# Patient Record
Sex: Female | Born: 1960 | Race: White | Hispanic: No | Marital: Married | Smoking: Former smoker
Health system: Southern US, Community
[De-identification: ages and names within clinical notes are randomized; demographics above are authoritative.]

## PROBLEM LIST (undated history)

## (undated) DIAGNOSIS — T7840XA Allergy, unspecified, initial encounter: Secondary | ICD-10-CM

## (undated) DIAGNOSIS — F329 Major depressive disorder, single episode, unspecified: Secondary | ICD-10-CM

## (undated) DIAGNOSIS — F419 Anxiety disorder, unspecified: Secondary | ICD-10-CM

## (undated) DIAGNOSIS — F32A Depression, unspecified: Secondary | ICD-10-CM

## (undated) DIAGNOSIS — D649 Anemia, unspecified: Secondary | ICD-10-CM

## (undated) DIAGNOSIS — E785 Hyperlipidemia, unspecified: Secondary | ICD-10-CM

## (undated) DIAGNOSIS — G4730 Sleep apnea, unspecified: Secondary | ICD-10-CM

## (undated) HISTORY — DX: Allergy, unspecified, initial encounter: T78.40XA

## (undated) HISTORY — DX: Hyperlipidemia, unspecified: E78.5

## (undated) HISTORY — DX: Major depressive disorder, single episode, unspecified: F32.9

## (undated) HISTORY — DX: Anxiety disorder, unspecified: F41.9

## (undated) HISTORY — DX: Anemia, unspecified: D64.9

## (undated) HISTORY — DX: Sleep apnea, unspecified: G47.30

## (undated) HISTORY — PX: UPPER GI ENDOSCOPY: SHX6162

## (undated) HISTORY — PX: COLONOSCOPY: SHX174

## (undated) HISTORY — PX: TONSILLECTOMY: SUR1361

## (undated) HISTORY — DX: Depression, unspecified: F32.A

---

## 2001-11-22 ENCOUNTER — Ambulatory Visit (HOSPITAL_COMMUNITY): Admission: RE | Admit: 2001-11-22 | Discharge: 2001-11-22 | Payer: Self-pay | Admitting: Gastroenterology

## 2018-02-22 ENCOUNTER — Encounter: Payer: Self-pay | Admitting: Gastroenterology

## 2018-03-02 ENCOUNTER — Encounter: Payer: Self-pay | Admitting: Gastroenterology

## 2018-03-21 ENCOUNTER — Other Ambulatory Visit: Payer: Self-pay

## 2018-03-21 ENCOUNTER — Ambulatory Visit (AMBULATORY_SURGERY_CENTER): Payer: Self-pay

## 2018-03-21 VITALS — Ht 65.0 in | Wt 230.8 lb

## 2018-03-21 DIAGNOSIS — Z1211 Encounter for screening for malignant neoplasm of colon: Secondary | ICD-10-CM

## 2018-03-21 NOTE — Progress Notes (Signed)
No egg or soy allergy known to patient   pt verbalize having surgery 4-16 having her tonsils removed. Verbalize the day after, her husband could not wake her up. She was admitted to [facility] with elevated liver enzyme.  No diet pills per patient No home 02 use per patient  No blood thinners per patient  Pt denies issues with constipation  No A fib or A flutter  EMMI video sent to pt's e mail , pt declined.

## 2018-03-31 NOTE — Progress Notes (Signed)
Osvaldo Angst, CRNA reviewed the patient's hx- ok for [place]

## 2018-04-04 ENCOUNTER — Other Ambulatory Visit: Payer: Self-pay

## 2018-04-04 ENCOUNTER — Encounter: Payer: Self-pay | Admitting: Gastroenterology

## 2018-04-04 ENCOUNTER — Ambulatory Visit (AMBULATORY_SURGERY_CENTER): Admitting: Gastroenterology

## 2018-04-04 VITALS — BP 105/53 | HR 74 | Temp 97.5°F | Resp 17 | Ht 65.0 in | Wt 230.0 lb

## 2018-04-04 DIAGNOSIS — D124 Benign neoplasm of descending colon: Secondary | ICD-10-CM | POA: Diagnosis not present

## 2018-04-04 DIAGNOSIS — Z1211 Encounter for screening for malignant neoplasm of colon: Secondary | ICD-10-CM | POA: Diagnosis present

## 2018-04-04 DIAGNOSIS — K635 Polyp of colon: Secondary | ICD-10-CM | POA: Diagnosis not present

## 2018-04-04 DIAGNOSIS — D127 Benign neoplasm of rectosigmoid junction: Secondary | ICD-10-CM

## 2018-04-04 MED ORDER — SODIUM CHLORIDE 0.9 % IV SOLN: 500.0000 mL | Freq: Once | INTRAVENOUS | Status: DC

## 2018-04-04 NOTE — Patient Instructions (Signed)
YOU HAD AN ENDOSCOPIC PROCEDURE TODAY AT [facility]:   Refer to the procedure report that was given to you for any specific questions about what was found during the examination.  If the procedure report does not answer your questions, please call your gastroenterologist to clarify.  If you requested that your care partner not be given the details of your procedure findings, then the procedure report has been included in a sealed envelope for you to review at your convenience later.  YOU SHOULD EXPECT: Some feelings of bloating in the abdomen. Passage of more gas than usual.  Walking can help get rid of the air that was put into your GI tract during the procedure and reduce the bloating. If you had a lower endoscopy (such as a colonoscopy or flexible sigmoidoscopy) you may notice spotting of blood in your stool or on the toilet paper. If you underwent a bowel prep for your procedure, you may not have a normal bowel movement for a few days.  Please Note:  You might notice some irritation and congestion in your nose or some drainage.  This is from the oxygen used during your procedure.  There is no need for concern and it should clear up in a day or so.  SYMPTOMS TO REPORT IMMEDIATELY:   Following lower endoscopy (colonoscopy or flexible sigmoidoscopy):  Excessive amounts of blood in the stool  Significant tenderness or worsening of abdominal pains  Swelling of the abdomen that is new, acute  Fever of 100F or higher  For urgent or emergent issues, a gastroenterologist can be reached at any hour by calling [phone].   DIET:  We do recommend a small meal at first, but then you may proceed to your regular diet. Please follow a High Fiber Diet (see handout given to you by your recovery nurse.). Drink plenty of fluids but you should avoid alcoholic beverages for 24 hours.  MEDICATIONS: Continue present medications.  Please see handouts given to you by your recovery  nurse.  Follow up with Dr. Lyndel Safe as needed. Use Preparation H one twice daily after each bowel movement for 7-10 days as needed. If this is not effective, Dr. Lyndel Safe will recommend Anusol suppositories. If you are still having problems in the future, Dr. Lyndel Safe may recommend a surgical consult for hemorrhoidectomy as a last resort.  ACTIVITY:  You should plan to take it easy for the rest of today and you should NOT DRIVE or use heavy machinery until tomorrow (because of the sedation medicines used during the test).    FOLLOW UP: Our staff will call the number listed on your records the next business day following your procedure to check on you and address any questions or concerns that you may have regarding the information given to you following your procedure. If we do not reach you, we will leave a message.  However, if you are feeling well and you are not experiencing any problems, there is no need to return our call.  We will assume that you have returned to your regular daily activities without incident.  If any biopsies were taken you will be contacted by phone or by letter within the next 1-3 weeks.  Please call us at [phone] if you have not heard about the biopsies in 3 weeks.   Thank you for allowing [place] to provide for your healthcare needs today.  SIGNATURES/CONFIDENTIALITY: You and/or your care partner have signed paperwork which will be entered into your electronic  medical record.  These signatures attest to the fact that that the information above on your After Visit Summary has been reviewed and is understood.  Full responsibility of the confidentiality of this discharge information lies with you and/or your care-partner.

## 2018-04-04 NOTE — Progress Notes (Signed)
Called to room to assist during endoscopic procedure.  Patient ID and intended procedure confirmed with present staff. Received instructions for my participation in the procedure from the performing physician.  

## 2018-04-04 NOTE — Op Note (Signed)
[place] Patient Name: Kristy Walker Procedure Date: 04/04/2018 8:29 AM MRN: [id number] Endoscopist: Jackquline Denmark MD, MD Age: 57 Referring MD:  Date of Birth: Dec 20, 1960 Gender: Female Account #: [id number] Procedure:                Colonoscopy Indications:              Screening for colorectal malignant neoplasm Medicines:                Monitored Anesthesia Care Procedure:                Pre-Anesthesia Assessment:                           - Prior to the procedure, a History and Physical                            was performed, and patient medications and                            allergies were reviewed. The patient is competent.                            The risks and benefits of the procedure and the                            sedation options and risks were discussed with the                            patient. All questions were answered and informed                            consent was obtained. Patient identification and                            proposed procedure were verified by the physician                            in the procedure room. Mental Status Examination:                            alert and oriented. Prophylactic Antibiotics: The                            patient does not require prophylactic antibiotics.                            Prior Anticoagulants: The patient has taken no                            previous anticoagulant or antiplatelet agents. ASA                            Grade Assessment: II - A patient with mild systemic  disease. After reviewing the risks and benefits,                            the patient was deemed in satisfactory condition to                            undergo the procedure. The anesthesia plan was to                            use monitored anesthesia care (MAC). Immediately                            prior to administration of medications, the patient   was re-assessed for adequacy to receive sedatives.                            The heart rate, respiratory rate, oxygen                            saturations, blood pressure, adequacy of pulmonary                            ventilation, and response to care were monitored                            throughout the procedure. The physical status of                            the patient was re-assessed after the procedure.                           After obtaining informed consent, the colonoscope                            was passed under direct vision. Throughout the                            procedure, the patient's blood pressure, pulse, and                            oxygen saturations were monitored continuously. The                            Colonoscope was introduced through the anus and                            advanced to the the cecum, identified by                            appendiceal orifice and ileocecal valve. The                            colonoscopy was performed without difficulty. The  patient tolerated the procedure well. The quality                            of the bowel preparation was excellent. Scope In: 8:38:35 AM Scope Out: 8:52:15 AM Scope Withdrawal Time: 0 hours 11 minutes 10 seconds  Total Procedure Duration: 0 hours 13 minutes 40 seconds  Findings:                 Small external hemorrhoids were found on perianal                            exam.                           Healed anal fissure was found on perianal exam.                           A 4 mm polyp was found in the descending colon. The                            polyp was sessile. The polyp was removed with a                            cold biopsy forceps. Resection and retrieval were                            complete.                           A 6 mm polyp was found in the recto-sigmoid colon.                            The polyp was sessile. The polyp was removed  with a                            cold snare. Resection and retrieval were complete.                           A few small-mouthed diverticula were found in the                            sigmoid colon.                           Internal hemorrhoids were found during                            retroflexion. The hemorrhoids were Grade II                            (internal hemorrhoids that prolapse but reduce                            spontaneously). Complications:            No immediate complications.  Estimated Blood Loss:     Estimated blood loss was minimal. Impression:               - Colonic polyps status post polypectomy.                           - Mild sigmoid diverticulosis.                           - Internal hemorrhoids and healed posterior anal                            fissure. Recommendation:           - Patient has a contact number available for                            emergencies. The signs and symptoms of potential                            delayed complications were discussed with the                            patient. Return to normal activities tomorrow.                            Written discharge instructions were provided to the                            patient.                           - High fiber diet.                           - Continue present medications.                           - Await pathology results.                           - Repeat colonoscopy date to be determined after                            pending pathology results are reviewed for                            surveillance.                           - Return to GI clinic PRN. if she has any anorectal                            problems, she would use Preparation H one twice a                            day after the bowel movement for 7-10 days and get  in touch with [place]. at that point, we will give her                            Anusol suppositories. If  she still has any                            anorectal problems in the future, I would've low                            threshold in having surgical opinion for possible                            examination under anesthesia followed by                            hemorrhoidectomy. Certainly, that wi'll be the last                            resort. Jackquline Denmark MD, MD 04/04/2018 9:01:33 AM This report has been signed electronically.

## 2018-04-04 NOTE — Progress Notes (Signed)
To PACU, VSS. REport to [email or web address].tb 

## 2018-04-04 NOTE — Progress Notes (Signed)
Pt's states no medical or surgical changes since previsit or office visit. 

## 2018-04-05 ENCOUNTER — Telehealth: Payer: Self-pay

## 2018-04-05 NOTE — Telephone Encounter (Signed)
  Follow up Call-  Call back number 04/04/2018  Post procedure Call Back phone  # [phone]  Permission to leave phone message Yes  Some recent data might be hidden     Patient questions:  Do you have a fever, pain , or abdominal swelling? No. Pain Score  0 *  Have you tolerated food without any problems? Yes.    Have you been able to return to your normal activities? Yes.    Do you have any questions about your discharge instructions: Diet   No. Medications  No. Follow up visit  No.  Do you have questions or concerns about your Care? No.  Actions: * If pain score is 4 or above: No action needed, pain <4.

## 2018-04-10 ENCOUNTER — Encounter: Payer: Self-pay | Admitting: Gastroenterology

## 2020-04-30 ENCOUNTER — Other Ambulatory Visit: Payer: Self-pay | Admitting: Orthopedic Surgery

## 2020-05-21 ENCOUNTER — Ambulatory Visit (HOSPITAL_COMMUNITY)
Admit: 2020-05-21 | Discharge: 2020-05-21 | Disposition: A | Discharge status: Home / Self Care | Attending: Psychiatry | Admitting: Psychiatry

## 2020-05-21 NOTE — H&P (Signed)
Behavioral Health Medical Screening Exam  Kristy Walker is a 59 y.o. female who presented to [facility] as a walk-in, voluntarily. Per self-reported, patients psychiatric history is significant for major depressive disorder. Patient appeared very irritable. She stated that she came to [facility] after feeling," agitated, irritable, and inpatient."  Reported symptoms started at the end of  March. Reported currently receiving outpatient psychiatric services through the [place] with Dr. Landis Martins however, stated," I am tired of waiting for her to make changes in my medications." Stated," I had an appointment with my psychiatrist at the [facility] last Friday and she did blood work. She said my Lithium level was low but then told me I need more blood work done before changes would be made with my medications. I called the [place] office today but they told me she was out of the office and there was no one covering for her. I called to see if I could get admitted to the [place] but they told me they had no beds available so now I am here."  She denied current SI, HI and psychosis and history thereof. Denied prior suicide attempts or self-harming acts.Denied substance abuse or use. Denied prior inpatient psychiatric hospitalizations. Denied access to firearms.   Total Time spent with patient: 20 minutes  Psychiatric Specialty Exam  Presentation  General Appearance: Guarded  Eye Contact: fair Speech:Clear and coherent  Speech Volume: Normal  Handedness:No data recorded  Mood and Affect  Mood:Irritable  Affect: Constricted and congruent   Thought Process  Thought Processes:Logical  Descriptions of Associations:Intact  Orientation: Alert ad oreinted x4 Thought Content: Denied SI, HI and psychosis Hallucinations:Denied  Ideas of Reference:None Suicidal Thoughts:Denied Homicidal Thoughts:Denied   Sensorium  Memory:Intact Judgment:Fiar Insight: Shallow   Executive Functions  Concentration:Fair Attention  Span:Fair Recall:DFiar  Fund of Knowledge:Fiar Language:Good   Psychomotor Activity  Psychomotor Activity: Normal   Assets  Assets:No data recorded  Sleep  Sleep: No concerns  Physical Exam: Physical Exam Constitutional:      General: She is not in acute distress.    Appearance: Normal appearance. She is not ill-appearing.  Neurological:     Mental Status: She is alert and oriented to person, place, and time.  Psychiatric:        Behavior: Behavior normal.        Thought Content: Thought content normal.        Judgment: Judgment normal.    Review of Systems  Psychiatric/Behavioral: Negative for depression, hallucinations, memory loss, substance abuse and suicidal ideas. The patient is not nervous/anxious and does not have insomnia.        Irritability   All other systems reviewed and are negative.  There were no vitals taken for this visit. There is no height or weight on file to calculate BMI.  Musculoskeletal: Strength & Muscle Tone: within normal limits Gait & Station: normal Patient leans: N/A   Recommendations:  Based on my evaluation the patient does not appear to have an emergency medical condition.   Base don my evaluation, No evidence of imminent risk to self or others at present.   Patient does not meet criteria for psychiatric inpatient admission. Reccommended to continue follow-up with the [facility] for ongoing psychiatric services to include medication management.     Mordecai Maes, NP 05/21/2020, 1:44 PM

## 2020-07-09 ENCOUNTER — Encounter (HOSPITAL_BASED_OUTPATIENT_CLINIC_OR_DEPARTMENT_OTHER): Payer: Self-pay

## 2020-07-09 ENCOUNTER — Ambulatory Visit (HOSPITAL_BASED_OUTPATIENT_CLINIC_OR_DEPARTMENT_OTHER): Admit: 2020-07-09 | Admitting: Orthopedic Surgery

## 2020-07-09 SURGERY — RELEASE, A1 PULLEY, FOR TRIGGER FINGER
Anesthesia: Regional | Laterality: Left

## 2021-03-11 ENCOUNTER — Encounter: Payer: Self-pay | Admitting: Psychology

## 2021-04-14 ENCOUNTER — Ambulatory Visit (INDEPENDENT_AMBULATORY_CARE_PROVIDER_SITE_OTHER): Payer: No Typology Code available for payment source | Admitting: Gastroenterology

## 2021-04-14 ENCOUNTER — Other Ambulatory Visit: Payer: Self-pay

## 2021-04-14 ENCOUNTER — Encounter: Payer: Self-pay | Admitting: Gastroenterology

## 2021-04-14 ENCOUNTER — Other Ambulatory Visit (INDEPENDENT_AMBULATORY_CARE_PROVIDER_SITE_OTHER)

## 2021-04-14 VITALS — BP 110/80 | HR 84 | Ht 65.0 in | Wt 193.6 lb

## 2021-04-14 DIAGNOSIS — R1032 Left lower quadrant pain: Secondary | ICD-10-CM | POA: Diagnosis not present

## 2021-04-14 DIAGNOSIS — Z86010 Personal history of colonic polyps: Secondary | ICD-10-CM | POA: Diagnosis not present

## 2021-04-14 DIAGNOSIS — R1031 Right lower quadrant pain: Secondary | ICD-10-CM

## 2021-04-14 LAB — CBC WITH DIFFERENTIAL/PLATELET
Basophils Absolute: 0.1 10*3/uL (ref 0.0–0.1)
Basophils Relative: 1.1 % (ref 0.0–3.0)
Eosinophils Absolute: 0.3 10*3/uL (ref 0.0–0.7)
Eosinophils Relative: 3.7 % (ref 0.0–5.0)
HCT: 42.1 % (ref 36.0–46.0)
Hemoglobin: 13.9 g/dL (ref 12.0–15.0)
Lymphocytes Relative: 30.7 % (ref 12.0–46.0)
Lymphs Abs: 2.6 10*3/uL (ref 0.7–4.0)
MCHC: 33 g/dL (ref 30.0–36.0)
MCV: 92.2 fl (ref 78.0–100.0)
Monocytes Absolute: 0.4 10*3/uL (ref 0.1–1.0)
Monocytes Relative: 4.7 % (ref 3.0–12.0)
Neutro Abs: 5 10*3/uL (ref 1.4–7.7)
Neutrophils Relative %: 59.8 % (ref 43.0–77.0)
Platelets: 268 10*3/uL (ref 150.0–400.0)
RBC: 4.56 Mil/uL (ref 3.87–5.11)
RDW: 13.1 % (ref 11.5–15.5)
WBC: 8.3 10*3/uL (ref 4.0–10.5)

## 2021-04-14 LAB — HIGH SENSITIVITY CRP: CRP, High Sensitivity: 14.69 mg/L — ABNORMAL HIGH (ref 0.000–5.000)

## 2021-04-14 LAB — COMPREHENSIVE METABOLIC PANEL
ALT: 18 U/L (ref 0–35)
AST: 19 U/L (ref 0–37)
Albumin: 4.4 g/dL (ref 3.5–5.2)
Alkaline Phosphatase: 88 U/L (ref 39–117)
BUN: 11 mg/dL (ref 6–23)
CO2: 31 mEq/L (ref 19–32)
Calcium: 9.6 mg/dL (ref 8.4–10.5)
Chloride: 101 mEq/L (ref 96–112)
Creatinine, Ser: 0.84 mg/dL (ref 0.40–1.20)
GFR: 75.22 mL/min (ref >60.00)
Glucose, Bld: 100 mg/dL — ABNORMAL HIGH (ref 70–99)
Potassium: 5 mEq/L (ref 3.5–5.1)
Sodium: 139 mEq/L (ref 135–145)
Total Bilirubin: 0.3 mg/dL (ref 0.2–1.2)
Total Protein: 6.8 g/dL (ref 6.0–8.3)

## 2021-04-14 LAB — C-REACTIVE PROTEIN: CRP: 1.3 mg/dL (ref 0.5–20.0)

## 2021-04-14 NOTE — Progress Notes (Signed)
Chief Complaint: Abdominal pain/change in bowel habits.  Referring Provider:  Marco Collie, MD      ASSESSMENT AND PLAN;   #1. LLQ/RLQ/pelvic pain  #2. IBS with alternating diarrhea and constipation.  Neg celiac serology 11/2020, neg stool studies including C. difficile, GI panel, neg HP stool antigen. H/O rectal bleeding and wt loss (although most likely it is intentional) is concerning.  #3. H/O Polyps  Plan: -CT AP with PO and IV contrast -CBC, CMP, CRP -Colon thereafter.  I discussed risks and benefits.   Discussed risks & benefits. Risks including rare perforation req laparotomy, bleeding after bx/polypectomy req blood transfusion, rarely missing neoplasms, risks of anesthesia/sedation, rare risk of damage to internal organs. Benefits outweigh the risks. Patient agrees to proceed. All the questions were answered. Pt consents to proceed.  HPI:    Kristy Walker is a 60 y.o. female  With OSA on CPAP, HLD, anxiety/depression  C/O "Irregular BMs"-used to have more constipation, Dx with IBS-C, took Linzess and MiraLAX which resulted in more diarrhea.  Hence that was stopped.  He is currently taking psyllium 2 tsp/day.  Still having alternating diarrhea and constipation.  Has noticed some bright red blood per rectum-at times mixed with the stool.  Has been advised to get colonoscopy performed.  C/O lower abdominal pain-crampy, getting worse, only occasionally relieved by defecation.  Had subjective fever and chills in past  Reflux and is more or less "resolved" since she has stopped smoking.  He uses omeprazole on a as needed basis and has been using it 1 every 2 to 3 weeks.  No odynophagia or dysphagia.  She denies having any nausea/vomiting.   Has been trying to lose weight-has lost weight using Noom diet.  Wt Readings from Last 3 Encounters:  04/14/21 193 lb 9.6 oz (87.8 kg)  04/04/18 230 lb (104.3 kg)  03/21/18 230 lb 12.8 oz (104.7 kg)    Past GI  procedures: Colonoscopy 11/2014: ([facility])Significant colonic polyps s/p polypectomy, internal and external hemorrhoids. Bx- TAs. Colon 03/2018:  - Colonic polyps s/p polypectomy. - Mild sigmoid diverticulosis. - Internal hemorrhoids and healed posterior anal fissure.  Past Medical History:  Diagnosis Date  . Allergy   . Anxiety   . Depression   . Hyperlipidemia   . Sleep apnea    cpap    Past Surgical History:  Procedure Laterality Date  . COLONOSCOPY    . TONSILLECTOMY     april 2016    Family History  Problem Relation Age of Onset  . Colitis Mother   . Heart attack Father   . Colitis Sister   . Depression Sister   . Colon cancer Neg Hx   . Esophageal cancer Neg Hx   . Rectal cancer Neg Hx   . Stomach cancer Neg Hx     Social History   Tobacco Use  . Smoking status: Current Every Day Smoker  . Smokeless tobacco: Never Used  . Tobacco comment:  1 pack a day, smoking about 40 years  Substance Use Topics  . Alcohol use: Yes    Comment: rarely  . Drug use: Never    Current Outpatient Medications  Medication Sig Dispense Refill  . ARIPiprazole (ABILIFY) 5 MG tablet Take 5 mg by mouth daily.    Marland Kitchen aspirin EC 81 MG tablet Take 81 mg by mouth daily.    . busPIRone (BUSPAR) 15 MG tablet Take 15 mg by mouth 2 (two) times daily.    . calcium-vitamin  D (OSCAL WITH D) 250-125 MG-UNIT tablet Take 1 tablet by mouth daily.    . DULoxetine (CYMBALTA) 60 MG capsule Take 120 mg by mouth daily.    . Multiple Vitamin (MULTIVITAMIN) tablet Take 1 tablet by mouth daily.    . pravastatin (PRAVACHOL) 80 MG tablet Take 80 mg by mouth at bedtime.     Current Facility-Administered Medications  Medication Dose Route Frequency Provider Last Rate Last Admin  . 0.9 %  sodium chloride infusion  500 mL Intravenous Once Jackquline Denmark, MD        No Known Allergies  Review of Systems:  Constitutional: Denies fever, chills, diaphoresis, appetite change and fatigue.  HEENT: Denies photophobia,  eye pain, redness, hearing loss, ear pain, congestion, sore throat, rhinorrhea, sneezing, mouth sores, neck pain, neck stiffness and tinnitus.   Respiratory: Denies SOB, DOE, cough, chest tightness,  and wheezing.   Cardiovascular: Denies chest pain, palpitations and leg swelling.  Genitourinary: Denies dysuria, urgency, frequency, hematuria, flank pain and difficulty urinating.  Musculoskeletal: Denies myalgias, back pain, joint swelling, arthralgias and gait problem.  Skin: No rash.  Neurological: Denies dizziness, seizures, syncope, weakness, light-headedness, numbness and headaches.  Hematological: Denies adenopathy. Easy bruising, personal or family bleeding history  Psychiatric/Behavioral: Has anxiety or depression     Physical Exam:    BP 110/80 (BP Location: Left Arm)   Pulse 84   Ht 5\' 5"  (1.651 m)   Wt 193 lb 9.6 oz (87.8 kg)   BMI 32.22 kg/m  Wt Readings from Last 3 Encounters:  04/14/21 193 lb 9.6 oz (87.8 kg)  04/04/18 230 lb (104.3 kg)  03/21/18 230 lb 12.8 oz (104.7 kg)   Constitutional:  Well-developed, in no acute distress. Psychiatric: Normal mood and affect. Behavior is normal. HEENT: Pupils normal.  Conjunctivae are normal. No scleral icterus. Cardiovascular: Normal rate, regular rhythm. No edema Pulmonary/chest: Effort normal and breath sounds normal. No wheezing, rales or rhonchi. Abdominal: Soft, nondistended.  Mild bilateral lower abdominal tenderness. Bowel sounds active throughout. There are no masses palpable. No hepatomegaly. Rectal: Deferred Neurological: Alert and oriented to person place and time. Skin: Skin is warm and dry. No rashes noted.  Data Reviewed: I have personally reviewed following labs and imaging studies    Carmell Austria, MD 04/14/2021, 9:09 AM  Cc: Marco Collie, MD

## 2021-04-14 NOTE — Addendum Note (Signed)
Addended by: Curlene Labrum E on: 04/14/2021 04:45 PM   Modules accepted: Orders

## 2021-04-14 NOTE — Patient Instructions (Signed)
If you are age 61 or older, your body mass index should be between 23-30. Your Body mass index is 32.22 kg/m. If this is out of the aforementioned range listed, please consider follow up with your Primary Care Provider.  If you are age 71 or younger, your body mass index should be between 19-25. Your Body mass index is 32.22 kg/m. If this is out of the aformentioned range listed, please consider follow up with your Primary Care Provider.   Please go to the lab on the 2nd floor [address] before you leave the office today.   You [facility] [place] [id number] [address]   You are scheduled on          at        . You should arrive 15 minutes prior to your appointment time for registration. Please follow the written instructions below on the day of your exam:  WARNING: IF YOU ARE ALLERGIC TO IODINE/X-RAY DYE, PLEASE NOTIFY RADIOLOGY IMMEDIATELY AT [phone]! YOU WILL BE GIVEN A 13 HOUR PREMEDICATION PREP.  1) Do not eat or drink anything after        (4 hours prior to your test) 2) You have been given 2 bottles of oral contrast to drink. The solution may taste better if refrigerated, but do NOT add ice or any other liquid to this solution. Shake well before drinking.    Drink 1 bottle of contrast @        (2 hours prior to your exam)  Drink 1 bottle of contrast @        (1 hour prior to your exam)  You may take any medications as prescribed with a small amount of water, if necessary. If you take any of the following medications: METFORMIN, GLUCOPHAGE, GLUCOVANCE, AVANDAMET, RIOMET, FORTAMET, [place] MET, JANUMET, GLUMETZA or METAGLIP, you MAY be asked to HOLD this medication 48 hours AFTER the exam.  The purpose of you drinking the oral contrast is to aid in the visualization of your intestinal tract. The contrast solution may cause some diarrhea. Depending on your individual set of  symptoms, you may also receive an intravenous injection of x-ray contrast/dye. Plan on being at [facility] for 30 minutes or longer, depending on the type of exam you are having performed.  This test typically takes 30-45 minutes to complete.  If you have any questions regarding your exam or if you need to reschedule, you may call the CT department at [phone] between the hours of 8:00 am and 5:00 pm, Monday-Friday.  ________________________________________________________________________  Thank you,  Dr. Jackquline Denmark

## 2021-04-14 NOTE — Addendum Note (Signed)
Addended by: Curlene Labrum E on: 04/14/2021 04:20 PM   Modules accepted: Orders

## 2021-04-16 ENCOUNTER — Encounter (HOSPITAL_BASED_OUTPATIENT_CLINIC_OR_DEPARTMENT_OTHER): Payer: Self-pay

## 2021-04-16 ENCOUNTER — Other Ambulatory Visit: Payer: Self-pay

## 2021-04-16 ENCOUNTER — Ambulatory Visit (HOSPITAL_BASED_OUTPATIENT_CLINIC_OR_DEPARTMENT_OTHER)
Admission: RE | Admit: 2021-04-16 | Discharge: 2021-04-16 | Disposition: A | Discharge status: Home / Self Care | Source: Ambulatory Visit | Attending: Gastroenterology | Admitting: Gastroenterology

## 2021-04-16 DIAGNOSIS — R1032 Left lower quadrant pain: Secondary | ICD-10-CM | POA: Diagnosis present

## 2021-04-16 DIAGNOSIS — R1031 Right lower quadrant pain: Secondary | ICD-10-CM | POA: Diagnosis present

## 2021-04-16 DIAGNOSIS — Z86010 Personal history of colonic polyps: Secondary | ICD-10-CM | POA: Insufficient documentation

## 2021-04-16 MED ORDER — IOHEXOL 300 MG/ML  SOLN
100.0000 mL | Freq: Once | INTRAMUSCULAR | Status: AC | PRN
  Administered 2021-04-16: 100 mL via INTRAVENOUS

## 2021-04-22 NOTE — Progress Notes (Signed)
Please inform the patient. Excellent news-no acute abnormalities. Has 2 small pulmonary nodules They recommend CT chest in 6 months-can be arranged through Dr. Marco Collie office Send report to Dr. Marco Collie.

## 2021-05-02 ENCOUNTER — Other Ambulatory Visit: Payer: Self-pay

## 2021-05-02 ENCOUNTER — Other Ambulatory Visit (INDEPENDENT_AMBULATORY_CARE_PROVIDER_SITE_OTHER)

## 2021-05-02 DIAGNOSIS — Z860100 Personal history of colon polyps, unspecified: Secondary | ICD-10-CM

## 2021-05-02 DIAGNOSIS — R1031 Right lower quadrant pain: Secondary | ICD-10-CM

## 2021-05-02 DIAGNOSIS — R1032 Left lower quadrant pain: Secondary | ICD-10-CM

## 2021-05-02 LAB — HIGH SENSITIVITY CRP: CRP, High Sensitivity: 12.57 mg/L — ABNORMAL HIGH (ref 0.000–5.000)

## 2021-05-02 NOTE — Progress Notes (Signed)
Patient called in because she wanted to have repeat labs drawn today while she is at [facility] office. See 04/14/21 result note. Order in epic.

## 2021-06-04 ENCOUNTER — Other Ambulatory Visit: Payer: Self-pay

## 2021-06-04 ENCOUNTER — Ambulatory Visit (AMBULATORY_SURGERY_CENTER): Payer: No Typology Code available for payment source | Admitting: Gastroenterology

## 2021-06-04 ENCOUNTER — Encounter: Payer: Self-pay | Admitting: Gastroenterology

## 2021-06-04 VITALS — BP 109/46 | HR 56 | Temp 97.8°F | Resp 16 | Ht 65.0 in | Wt 193.0 lb

## 2021-06-04 DIAGNOSIS — R1032 Left lower quadrant pain: Secondary | ICD-10-CM

## 2021-06-04 DIAGNOSIS — K635 Polyp of colon: Secondary | ICD-10-CM | POA: Diagnosis not present

## 2021-06-04 DIAGNOSIS — K5730 Diverticulosis of large intestine without perforation or abscess without bleeding: Secondary | ICD-10-CM | POA: Diagnosis not present

## 2021-06-04 DIAGNOSIS — D125 Benign neoplasm of sigmoid colon: Secondary | ICD-10-CM

## 2021-06-04 DIAGNOSIS — K641 Second degree hemorrhoids: Secondary | ICD-10-CM

## 2021-06-04 MED ORDER — SODIUM CHLORIDE 0.9 % IV SOLN: 500.0000 mL | Freq: Once | INTRAVENOUS | Status: DC

## 2021-06-04 NOTE — Progress Notes (Signed)
C.W. vital signs. 

## 2021-06-04 NOTE — Progress Notes (Signed)
Called to room to assist during endoscopic procedure.  Patient ID and intended procedure confirmed with present staff. Received instructions for my participation in the procedure from the performing physician.  

## 2021-06-04 NOTE — Progress Notes (Signed)
Pt's states no medical or surgical changes since previsit or office visit. 

## 2021-06-04 NOTE — Progress Notes (Signed)
A/ox3, pleased with MAC, report to RN 

## 2021-06-04 NOTE — Op Note (Signed)
[facility] Patient Name: Kristy Walker Procedure Date: 06/04/2021 8:31 AM MRN: [id number] Endoscopist: Jackquline Denmark , MD Age: 60 Referring MD:  Date of Birth: 03-12-1961 Gender: Female Account #: [id number] Procedure:                Colonoscopy Indications:              Abdominal pain in the left lower quadrant, Pelvic                            pain. H/O polyps. Medicines:                Monitored Anesthesia Care Procedure:                Pre-Anesthesia Assessment:                           - Prior to the procedure, a History and Physical                            was performed, and patient medications and                            allergies were reviewed. The patient's tolerance of                            previous anesthesia was also reviewed. The risks                            and benefits of the procedure and the sedation                            options and risks were discussed with the patient.                            All questions were answered, and informed consent                            was obtained. Prior Anticoagulants: The patient has                            taken no previous anticoagulant or antiplatelet                            agents. ASA Grade Assessment: II - A patient with                            mild systemic disease. After reviewing the risks                            and benefits, the patient was deemed in                            satisfactory condition to undergo the procedure.  After obtaining informed consent, the colonoscope                            was passed under direct vision. Throughout the                            procedure, the patient's blood pressure, pulse, and                            oxygen saturations were monitored continuously. The                            Olympus PCF-H190DL (#[id number]) Colonoscope was                            introduced through the anus and advanced to the 2                             cm into the ileum. The colonoscopy was performed                            without difficulty. The patient tolerated the                            procedure well. The quality of the bowel                            preparation was good. The terminal ileum, ileocecal                            valve, appendiceal orifice, and rectum were                            photographed. Scope In: 8:36:16 AM Scope Out: 8:50:49 AM Scope Withdrawal Time: 0 hours 10 minutes 51 seconds  Total Procedure Duration: 0 hours 14 minutes 33 seconds  Findings:                 A 4 mm polyp was found in the mid sigmoid colon.                            The polyp was sessile. The polyp was removed with a                            cold snare. Resection and retrieval were complete.                           The colon (entire examined portion) appeared                            normal. Biopsies for histology were taken with a                            cold forceps from the entire colon  for evaluation                            of microscopic colitis.                           A few small-mouthed diverticula were found in the                            sigmoid colon.                           Non-bleeding external and internal hemorrhoids were                            found during retroflexion. The hemorrhoids were                            small and Grade II (internal hemorrhoids that                            prolapse but reduce spontaneously).                           The terminal ileum appeared normal.                           The exam was otherwise without abnormality on                            direct and retroflexion views. Complications:            No immediate complications. Estimated Blood Loss:     Estimated blood loss: none. Impression:               - One 4 mm polyp in the mid sigmoid colon, removed                            with a cold snare. Resected and  retrieved.                           - The entire examined colon is normal. Biopsied.                           - Minimal sigmoid diverticulosis.                           - Non-bleeding external and internal hemorrhoids.                           - The examined portion of the ileum was normal.                           - The examination was otherwise normal on direct                            and  retroflexion views. Recommendation:           - Patient has a contact number available for                            emergencies. The signs and symptoms of potential                            delayed complications were discussed with the                            patient. Return to normal activities tomorrow.                            Written discharge instructions were provided to the                            patient.                           - Resume previous diet.                           - Continue present medications.                           - Await pathology results.                           - Repeat colonoscopy for surveillance based on                            pathology results.                           - The findings and recommendations were discussed                            with the patient's family. Jackquline Denmark, MD 06/04/2021 8:56:14 AM This report has been signed electronically.

## 2021-06-04 NOTE — Patient Instructions (Signed)
YOU HAD AN ENDOSCOPIC PROCEDURE TODAY AT THE [facility]:   Refer to the procedure report that was given to you for any specific questions about what was found during the examination.  If the procedure report does not answer your questions, please call your gastroenterologist to clarify.  If you requested that your care partner not be given the details of your procedure findings, then the procedure report has been included in a sealed envelope for you to review at your convenience later.  YOU SHOULD EXPECT: Some feelings of bloating in the abdomen. Passage of more gas than usual.  Walking can help get rid of the air that was put into your GI tract during the procedure and reduce the bloating. If you had a lower endoscopy (such as a colonoscopy or flexible sigmoidoscopy) you may notice spotting of blood in your stool or on the toilet paper. If you underwent a bowel prep for your procedure, you may not have a normal bowel movement for a few days.  Please Note:  You might notice some irritation and congestion in your nose or some drainage.  This is from the oxygen used during your procedure.  There is no need for concern and it should clear up in a day or so.  SYMPTOMS TO REPORT IMMEDIATELY:   Following lower endoscopy (colonoscopy or flexible sigmoidoscopy):  Excessive amounts of blood in the stool  Significant tenderness or worsening of abdominal pains  Swelling of the abdomen that is new, acute  Fever of 100F or higher   Following upper endoscopy (EGD)  Vomiting of blood or coffee ground material  New chest pain or pain under the shoulder blades  Painful or persistently difficult swallowing  New shortness of breath  Fever of 100F or higher  Black, tarry-looking stools  For urgent or emergent issues, a gastroenterologist can be reached at any hour by calling [phone]. Do not use MyChart messaging for urgent concerns.    DIET:  We do recommend a small meal at first, but  then you may proceed to your regular diet.  Drink plenty of fluids but you should avoid alcoholic beverages for 24 hours.  ACTIVITY:  You should plan to take it easy for the rest of today and you should NOT DRIVE or use heavy machinery until tomorrow (because of the sedation medicines used during the test).    FOLLOW UP: Our staff will call the number listed on your records 48-72 hours following your procedure to check on you and address any questions or concerns that you may have regarding the information given to you following your procedure. If we do not reach you, we will leave a message.  We will attempt to reach you two times.  During this call, we will ask if you have developed any symptoms of COVID 19. If you develop any symptoms (ie: fever, flu-like symptoms, shortness of breath, cough etc.) before then, please call [phone].  If you test positive for Covid 19 in the 2 weeks post procedure, please call and report this information to us.    If any biopsies were taken you will be contacted by phone or by letter within the next 1-3 weeks.  Please call us at [phone] if you have not heard about the biopsies in 3 weeks.    SIGNATURES/CONFIDENTIALITY: You and/or your care partner have signed paperwork which will be entered into your electronic medical record.  These signatures attest to the fact that that the information above on   your After Visit Summary has been reviewed and is understood.  Full responsibility of the confidentiality of this discharge information lies with you and/or your care-partner. 

## 2021-06-06 ENCOUNTER — Telehealth: Payer: Self-pay

## 2021-06-06 ENCOUNTER — Telehealth: Payer: Self-pay | Admitting: *Deleted

## 2021-06-06 NOTE — Telephone Encounter (Signed)
Attempted to reach pt . With follow-up call following endoscopic procedure 06/04/2021.  LM on pt. Voice mail.  Will try to reach pt. Again later today.

## 2021-06-06 NOTE — Telephone Encounter (Signed)
  Follow up Call-  Call back number 06/04/2021  Post procedure Call Back phone  # [phone]  Permission to leave phone message Yes  Some recent data might be hidden     Patient questions:  Do you have a fever, pain , or abdominal swelling? No. Pain Score  0 *  Have you tolerated food without any problems? Yes.    Have you been able to return to your normal activities? Yes.    Do you have any questions about your discharge instructions: Diet   No. Medications  No. Follow up visit  No.  Do you have questions or concerns about your Care? No.  Actions: * If pain score is 4 or above: No action needed, pain <4.  Have you developed a fever since your procedure? no  2.   Have you had an respiratory symptoms (SOB or cough) since your procedure? no  3.   Have you tested positive for COVID 19 since your procedure no  4.   Have you had any family members/close contacts diagnosed with the COVID 19 since your procedure?  no   If yes to any of these questions please route to Joylene John, RN and Joella Prince, RN

## 2021-06-21 ENCOUNTER — Encounter: Payer: Self-pay | Admitting: Gastroenterology

## 2021-07-31 ENCOUNTER — Encounter: Payer: No Typology Code available for payment source | Attending: Psychology | Admitting: Psychology

## 2021-07-31 ENCOUNTER — Other Ambulatory Visit: Payer: Self-pay

## 2021-07-31 ENCOUNTER — Encounter: Payer: Self-pay | Admitting: Psychology

## 2021-07-31 DIAGNOSIS — Z716 Tobacco abuse counseling: Secondary | ICD-10-CM | POA: Diagnosis not present

## 2021-07-31 DIAGNOSIS — F319 Bipolar disorder, unspecified: Secondary | ICD-10-CM | POA: Diagnosis not present

## 2021-07-31 NOTE — Progress Notes (Signed)
Neuropsychological Consultation   Patient:   Kristy Walker   DOB:   1961/05/15  MR Number:  [id number]  Location:  [facility] [place], [id number] [place] [address] Dept: [phone]           Date of Service:   07/31/2021  Start Time:   1 PM End Time:   2 PM  Provider/Observer:  Ilean Skill, Psy.D.       Clinical Neuropsychologist       Billing Code/Service: Diagnostic clinical interview  Chief Complaint:    Kristy Walker is a 60 year old female referred by the [facility] for smoking cessation counseling and psychotherapy.  The patient reports that she is here for assistance in smoking cessation and is a 45-year smoker who has been unable to quit for a very long time.  The patient also has a long history of mood disturbance primarily depressive episodes but also adverse response to SSRI type medications triggering hypomanic or manic episode.  The patient is being followed by psychiatry for her care and has been able to find a medication regimen that has been fairly effective in managing her mood disorder.  The patient knows that she needs to quit smoking for health related issues.  Current Status:  The patient reports that she has had some increase in anxiety and agitation and is persistent after her last inpatient hospitalization with an adverse response when attempting to use medications for her depressive state at the time.  The patient reports that this increased "edginess" has made it even harder for her to attempt to quit smoking.  The patient reports numerous unsuccessful attempts to quit smoking.  She reports that she is used medication interventions including Wellbutrin and also has tried Chantix in the past.  While she did not have any adverse psychiatric events with Chantix she returned to smoking.  Behavioral Observation: Kristy Walker   presents as a 60 y.o.-year-old Right Caucasian Female who appeared her stated age. her dress was Appropriate and she was Well Groomed and her manners were Appropriate to the situation.  her participation was indicative of Appropriate and Attentive behaviors.  There were not physical disabilities noted.  she displayed an appropriate level of cooperation and motivation.     Interactions:    Active Appropriate  Attention:   within normal limits and attention span and concentration were age appropriate  Memory:   within normal limits; recent and remote memory intact  Visuo-spatial:  not examined  Speech (Volume):  normal  Speech:   normal; normal  Thought Process:  Coherent and Relevant  Though Content:  WNL; not suicidal and not homicidal  Orientation:   person, place, time/date, and situation  Judgment:   Good  Planning:   Good  Affect:    Anxious  Mood:    Anxious  Insight:   Good  Intelligence:   normal  Marital Status/Living: The patient was born and raised in [place] along with 2 siblings.  The patient currently lives with her spouse of 61 years.  The patient has no children.  Current Employment: The patient is retired from the [employer].  Past Employment:  The patient was in the [employer] for 20 years between 1981 and 2001.  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  Substance Use:  There is  a documented history of tobacco abuse confirmed by the patient.  The patient is a 45 year history of smoking  Education:   The patient graduated from high school and received her associates degree from [employer]  Medical History:   Past Medical History:  Diagnosis Date   Allergy    Anxiety    Depression    Hyperlipidemia    Sleep apnea    cpap        There are no problems to display for this patient.             Abuse/Trauma History: The patient did not  report history of traumatic experiences.  Psychiatric History:  The patient does have a psychiatric history.  If initially it was assumed to be a major depressive disorder but further information and response to medications merited down to a mood disorder likely related to bipolar disorder with more depressive events than manic or hypomanic events.  Family Med/Psych History:  Family History  Problem Relation Age of Onset   Colitis Mother    Heart attack Father    Colitis Sister    Depression Sister    Colon cancer Neg Hx    Esophageal cancer Neg Hx    Rectal cancer Neg Hx    Stomach cancer Neg Hx     Impression/DX:  Kristy Walker is a 60 year old female referred by the [facility] for smoking cessation counseling and psychotherapy.  The patient reports that she is here for assistance in smoking cessation and is a 45-year smoker who has been unable to quit for a very long time.  The patient also has a long history of mood disturbance primarily depressive episodes but also adverse response to SSRI type medications triggering hypomanic or manic episode.  The patient is being followed by psychiatry for her care and has been able to find a medication regimen that has been fairly effective in managing her mood disorder.  The patient knows that she needs to quit smoking for health related issues.  Disposition/Plan:  As I am not quite sure how the patient matriculated down to my schedule as I am a neuropsychologist and do not do smoking cessation work this was not caught by my staff before the appointment happen.  I did spend time with the patient going over information about smoking cessation and helping her understand the biology of tobacco use and abuse and strategies that have been more effective or less effective for long-term smoking cessation.  Reviewed her history and it appears that she has been well managed from a psychiatric standpoint at this time.  I gave the patient some  information about various resources and strategies to use and also directed her to [facility] behavioral health where they do more smoking cessation work.  While I do think that the patient is a very good candidate and highly motivated the limitations of my schedule itself would not allow me to see the patient frequently enough to provide the services that she is in most need of.  I do remain available for the patient in the future if needed but simple availability limitations in my schedule keep it from being appropriate for her to be managed by me for smoking cessation.  Diagnosis:    Bipolar affective disorder, remission status unspecified ([place])  Tobacco abuse counseling         Electronically Signed   _______________________ Ilean Skill, Psy.D. Clinical Neuropsychologist

## 2021-09-08 ENCOUNTER — Ambulatory Visit (INDEPENDENT_AMBULATORY_CARE_PROVIDER_SITE_OTHER): Payer: No Typology Code available for payment source | Admitting: Gastroenterology

## 2021-09-08 ENCOUNTER — Other Ambulatory Visit: Payer: Self-pay

## 2021-09-08 ENCOUNTER — Encounter: Payer: Self-pay | Admitting: Gastroenterology

## 2021-09-08 VITALS — BP 140/80 | HR 73 | Ht 65.0 in | Wt 197.0 lb

## 2021-09-08 DIAGNOSIS — K589 Irritable bowel syndrome without diarrhea: Secondary | ICD-10-CM

## 2021-09-08 DIAGNOSIS — R1031 Right lower quadrant pain: Secondary | ICD-10-CM

## 2021-09-08 DIAGNOSIS — R1032 Left lower quadrant pain: Secondary | ICD-10-CM | POA: Diagnosis not present

## 2021-09-08 MED ORDER — DICYCLOMINE HCL 10 MG PO CAPS: 10.0000 mg | ORAL_CAPSULE | Freq: Two times a day (BID) | ORAL | 4 refills | Status: DC

## 2021-09-08 NOTE — Patient Instructions (Signed)
If you are age 61 or older, your body mass index should be between 23-30. Your Body mass index is 32.78 kg/m. If this is out of the aforementioned range listed, please consider follow up with your Primary Care Provider.  If you are age 34 or younger, your body mass index should be between 19-25. Your Body mass index is 32.78 kg/m. If this is out of the aformentioned range listed, please consider follow up with your Primary Care Provider.   __________________________________________________________  The [facility] providers would like to encourage you to use [facility] to communicate with providers for non-urgent requests or questions.  Due to long hold times on the telephone, sending your provider a message by [facility] may be a faster and more efficient way to get a response.  Please allow 48 business hours for a response.  Please remember that this is for non-urgent requests.   We have sent the following medications to your pharmacy for you to pick up at your convenience: Bentyl  Please call in 2 weeks to speak to the nurse to give [place] an update.  Please quit smoking.  Thank you,  Dr. Jackquline Denmark

## 2021-09-08 NOTE — Progress Notes (Signed)
Chief Complaint: FU  Referring Provider:  Marco Collie, MD      ASSESSMENT AND PLAN;   #1. LLQ/RLQ/pelvic pain. Neg colon/CT 05/2021 as below.  #2. IBS with alternating diarrhea and constipation.  Neg celiac serology 11/2020, neg stool studies including C. difficile, GI panel, neg HP stool antigen. Neg colon 05/2021 with neg random colon Bx.  #3. H/O Polyps  Plan: -Bentyl 10mg  BID  1/2hr before meals. #60, 2 refills -Call in 2 weeks. -Quit smoking. -FU in 24 weeks. If still with problems, further WU   HPI:    Kristy Walker is a 60 y.o. female  With OSA on CPAP, HLD, anxiety/depression  With intermittent diarrhea (2-3/day)especially after eating, lower abdominal and pelvic discomfort without any significant pain.  No nocturnal symptoms.  She had negative CT Abdo/pelvis and colonoscopy as below.  Most recently had cardiac stress test which was unremarkable.  Unfortunately, she again started smoking.  Is trying to quit.  Denies having any constipation.  No fever chills or night sweats.  No recent weight loss.  No GYN problems   From previous notes: -C/O "Irregular BMs"-used to have more constipation, Dx with IBS-C, took Linzess and MiraLAX which resulted in more diarrhea.  Hence that was stopped.   -Reflux and is more or less "resolved" since she has stopped smoking.  He uses omeprazole on a as needed basis and has been using it 1 every 2 to 3 weeks.  No odynophagia or dysphagia.  She denies having any nausea/vomiting.  Has been trying to lose weight-has lost weight using Noom diet.  Now with more diarrhea - loose 2-3/day, after eating. No abdo pain now. No pellet like stools.  Wt Readings from Last 3 Encounters:  09/08/21 197 lb (89.4 kg)  06/04/21 193 lb (87.5 kg)  04/14/21 193 lb 9.6 oz (87.8 kg)    Past GI procedures: Colonoscopy 05/2021 - One 4 mm polyp in the mid sigmoid colon, removed with a cold snare. Resected and retrieved. Bx-hyperplastic polyp -  The entire examined colon is normal. Bx- neg for microscopic colitis. - Minimal sigmoid diverticulosis.  Colon 11/2014: (PC[facility])Significant colonic polyps s/p polypectomy, internal and external hemorrhoids. Bx- TAs. Colon 03/2018:  - Colonic polyps s/p polypectomy. - Mild sigmoid diverticulosis. - Internal hemorrhoids and healed posterior anal fissure.  CT Abdo/pelvis 04/2021 1. No acute findings in the abdomen or pelvis. 2. Ground-glass nodule at the LEFT lung base 5 mm. 6 x 3 mm subpleural nodule at the RIGHT lung base.  [facility]. Aortic atherosclerosis.  Past Medical History:  Diagnosis Date   Allergy    Anxiety    Depression    Hyperlipidemia    Sleep apnea    cpap    Past Surgical History:  Procedure Laterality Date   COLONOSCOPY     TONSILLECTOMY     april 2016    Family History  Problem Relation Age of Onset   Colitis Mother    Heart attack Father    Colitis Sister    Depression Sister    Colon cancer Neg Hx    Esophageal cancer Neg Hx    Rectal cancer Neg Hx    Stomach cancer Neg Hx     Social History   Tobacco Use   Smoking status: Every Day   Smokeless tobacco: Never   Tobacco comments:     1 pack a day, smoking about 40 years  Substance Use Topics   Alcohol use: Yes  Comment: rarely   Drug use: Never    Current Outpatient Medications  Medication Sig Dispense Refill   lamoTRIgine (LAMICTAL) 150 MG tablet TAKE ONE TABLET BY MOUTH TWICE A DAY FOR MOOD     pravastatin (PRAVACHOL) 40 MG tablet TAKE ONE TABLET BY MOUTH AT BEDTIME FOR CHOLESTEROL     aspirin EC 81 MG tablet Take 81 mg by mouth daily.     calcium-vitamin D (OSCAL WITH D) 250-125 MG-UNIT tablet Take 1 tablet by mouth daily.     DULoxetine (CYMBALTA) 60 MG capsule Take 60 mg by mouth daily.     hydrOXYzine (ATARAX/VISTARIL) 10 MG tablet Take 10 mg by mouth 3 (three) times daily as needed.     Multiple Vitamin (MULTIVITAMIN) tablet Take 1 tablet by mouth daily.     Omega-3 Fatty  Acids (FISH-EPA PO) Take by mouth.     omeprazole (PRILOSEC) 40 MG capsule Take 40 mg by mouth daily.     pseudoephedrine (SUDAFED) 30 MG tablet Take 30 mg by mouth every 8 (eight) hours as needed for congestion.     VITAMIN D, CHOLECALCIFEROL, PO Take by mouth.     Current Facility-Administered Medications  Medication Dose Route Frequency Provider Last Rate Last Admin   0.9 %  sodium chloride infusion  500 mL Intravenous Once Jackquline Denmark, MD        No Known Allergies  Review of Systems:  Constitutional: Denies fever, chills, diaphoresis, appetite change and fatigue.  HEENT: Denies photophobia, eye pain, redness, hearing loss, ear pain, congestion, sore throat, rhinorrhea, sneezing, mouth sores, neck pain, neck stiffness and tinnitus.   Respiratory: Denies SOB, DOE, cough, chest tightness,  and wheezing.   Cardiovascular: Denies chest pain, palpitations and leg swelling.  Genitourinary: Denies dysuria, urgency, frequency, hematuria, flank pain and difficulty urinating.  Musculoskeletal: Denies myalgias, back pain, joint swelling, arthralgias and gait problem.  Skin: No rash.  Neurological: Denies dizziness, seizures, syncope, weakness, light-headedness, numbness and headaches.  Hematological: Denies adenopathy. Easy bruising, personal or family bleeding history  Psychiatric/Behavioral: Has anxiety or depression     Physical Exam:    BP 140/80   Pulse 73   Ht 5\' 5"  (1.651 m)   Wt 197 lb (89.4 kg)   SpO2 98%   BMI 32.78 kg/m  Wt Readings from Last 3 Encounters:  09/08/21 197 lb (89.4 kg)  06/04/21 193 lb (87.5 kg)  04/14/21 193 lb 9.6 oz (87.8 kg)   Constitutional:  Well-developed, in no acute distress. Psychiatric: Normal mood and affect. Behavior is normal. HEENT: Pupils normal.  Conjunctivae are normal. No scleral icterus. Cardiovascular: Normal rate, regular rhythm. No edema Pulmonary/chest: Effort normal and breath sounds normal. No wheezing, rales or  rhonchi. Abdominal: Soft, nondistended.  Mild bilateral lower abdominal tenderness. Bowel sounds active throughout. There are no masses palpable. No hepatomegaly. Rectal: Deferred Neurological: Alert and oriented to person place and time. Skin: Skin is warm and dry. No rashes noted.  Data Reviewed: I have personally reviewed following labs and imaging studies    Carmell Austria, MD 09/08/2021, 11:24 AM  Cc: Marco Collie, MD

## 2022-03-17 ENCOUNTER — Ambulatory Visit (INDEPENDENT_AMBULATORY_CARE_PROVIDER_SITE_OTHER): Admitting: Gastroenterology

## 2022-03-17 ENCOUNTER — Encounter: Payer: Self-pay | Admitting: Gastroenterology

## 2022-03-17 VITALS — BP 160/88 | HR 73 | Ht 65.0 in | Wt 205.1 lb

## 2022-03-17 DIAGNOSIS — K582 Mixed irritable bowel syndrome: Secondary | ICD-10-CM

## 2022-03-17 MED ORDER — LINACLOTIDE 72 MCG PO CAPS: 72.0000 ug | ORAL_CAPSULE | Freq: Every day | ORAL | 11 refills | Status: DC

## 2022-03-17 NOTE — Progress Notes (Signed)
? ? ?Chief Complaint: FU ? ?Referring Provider:  Marco Collie, MD    ? ? ?ASSESSMENT AND PLAN;  ? ?#1. LLQ/RLQ/pelvic pain. Neg colon/CT 05/2021 as below. ? ?#2. IBS with alternating diarrhea and constipation.  Neg celiac serology 11/2020, neg stool studies including C. difficile, GI panel, neg HP stool antigen. Neg colon 05/2021 with neg random colon Bx. ? ?#3. H/O Polyps ? ?Plan: ?-Colace 2/day ?-Linzess 79mg po Qsat (samples given) ?-Quit smoking. ?-FU in 24 weeks. If still with problems, further WU ? ? ?HPI:   ? ?Kristy TENAis a 61y.o. female  ?With OSA on CPAP, HLD, anxiety/depression ? ?Had cramps with bentyl-hence stopped ?Started colace 2/day, feels much better. ?Has "irregular" bowel movements with alternating constipation and diarrhea. ?No melena or hematochezia. ?No weight loss. ?No nocturnal symptoms. ? ?She had negative CT Abdo/pelvis and colonoscopy as below. ? ? ? ?From previous notes: ?-C/O "Irregular BMs"-used to have more constipation, Dx with IBS-C, took Linzess and MiraLAX which resulted in more diarrhea.  Hence that was stopped.  ? ?-Reflux and is more or less "resolved" since she has stopped smoking.  He uses omeprazole on a as needed basis and has been using it 1 every 2 to 3 weeks.  No odynophagia or dysphagia.  She denies having any nausea/vomiting. ? ?Wt Readings from Last 3 Encounters:  ?03/17/22 205 lb 2 oz (93 kg)  ?09/08/21 197 lb (89.4 kg)  ?06/04/21 193 lb (87.5 kg)  ? ?Back injury ?Gained wt. ? ?Past GI procedures: ?Colonoscopy 05/2021 ?- One 4 mm polyp in the mid sigmoid colon, removed with a cold snare. Bx-hyperplastic polyp ?- The entire examined colon is normal. Bx- neg for microscopic colitis. ?- Minimal sigmoid diverticulosis. ? ?Colon 11/2014: (PCF)Significant colonic polyps s/p polypectomy, internal and external hemorrhoids. Bx- TAs. Colon 03/2018:  ?- Colonic polyps s/p polypectomy. ?- Mild sigmoid diverticulosis. ?- Internal hemorrhoids and healed posterior anal  fissure. ? ?CT Abdo/pelvis 04/2021 ?1. No acute findings in the abdomen or pelvis. ?2. Ground-glass nodule at the LEFT lung base 5 mm. 6 x 3 mm ?subpleural nodule at the RIGHT lung base.  [facility] ?3. Aortic atherosclerosis. ? ?Past Medical History:  ?Diagnosis Date  ? Allergy   ? Anxiety   ? Depression   ? Hyperlipidemia   ? Sleep apnea   ? cpap  ? ? ?Past Surgical History:  ?Procedure Laterality Date  ? COLONOSCOPY    ? TONSILLECTOMY    ? april 2016  ? ? ?Family History  ?Problem Relation Age of Onset  ? Colitis Mother   ? Heart attack Father   ? Colitis Sister   ? Depression Sister   ? Colon cancer Neg Hx   ? Esophageal cancer Neg Hx   ? Rectal cancer Neg Hx   ? Stomach cancer Neg Hx   ? ? ?Social History  ? ?Tobacco Use  ? Smoking status: Every Day  ? Smokeless tobacco: Never  ? Tobacco comments:  ?   1 pack a day, smoking about 40 years  ?Vaping Use  ? Vaping Use: Never used  ?Substance Use Topics  ? Alcohol use: Yes  ?  Comment: rarely  ? Drug use: Never  ? ? ?Current Outpatient Medications  ?Medication Sig Dispense Refill  ? aspirin EC 81 MG tablet Take 81 mg by mouth daily.    ? calcium-vitamin D (OSCAL WITH D) 250-125 MG-UNIT tablet Take 1 tablet by mouth daily.    ? docusate  sodium (COLACE) 100 MG capsule Take 100 mg by mouth 2 (two) times daily.    ? DULoxetine (CYMBALTA) 60 MG capsule Take 60 mg by mouth daily.    ? gabapentin (NEURONTIN) 300 MG capsule Take 300 mg by mouth at bedtime.    ? HYDROcodone-acetaminophen (NORCO/VICODIN) 5-325 MG tablet Take 1 tablet by mouth every 6 (six) hours as needed for moderate pain.    ? hydrOXYzine (ATARAX/VISTARIL) 10 MG tablet Take 10 mg by mouth 3 (three) times daily as needed.    ? lamoTRIgine (LAMICTAL) 150 MG tablet TAKE ONE TABLET BY MOUTH TWICE A DAY FOR MOOD    ? Multiple Vitamin (MULTIVITAMIN) tablet Take 1 tablet by mouth daily.    ? Omega-3 Fatty Acids (FISH-EPA PO) Take by mouth.    ? omeprazole (PRILOSEC) 40 MG capsule Take 40 mg by mouth daily.     ? pravastatin (PRAVACHOL) 40 MG tablet TAKE ONE TABLET BY MOUTH AT BEDTIME FOR CHOLESTEROL    ? pseudoephedrine (SUDAFED) 30 MG tablet Take 30 mg by mouth every 8 (eight) hours as needed for congestion.    ? VITAMIN D, CHOLECALCIFEROL, PO Take by mouth.    ? dicyclomine (BENTYL) 10 MG capsule Take 1 capsule (10 mg total) by mouth 2 (two) times daily. Take 30 minutes before each meal (Patient not taking: Reported on 03/17/2022) 180 capsule 4  ? ?Current Facility-Administered Medications  ?Medication Dose Route Frequency Provider Last Rate Last Admin  ? 0.9 %  sodium chloride infusion  500 mL Intravenous Once Jackquline Denmark, MD      ? ? ?No Known Allergies ? ?Review of Systems:  ?Psychiatric/Behavioral: Has anxiety or depression ? ?  ? ?Physical Exam:   ? ?BP (!) 160/88   Pulse 73   Ht '5\' 5"'$  (1.651 m)   Wt 205 lb 2 oz (93 kg)   SpO2 96%   BMI 34.13 kg/m?  ?Wt Readings from Last 3 Encounters:  ?03/17/22 205 lb 2 oz (93 kg)  ?09/08/21 197 lb (89.4 kg)  ?06/04/21 193 lb (87.5 kg)  ? ?Constitutional:  Well-developed, in no acute distress. ?Psychiatric: Normal mood and affect. Behavior is normal. ?HEENT: Pupils normal.  Conjunctivae are normal. No scleral icterus. ?Cardiovascular: Normal rate, regular rhythm. No edema ?Pulmonary/chest: Effort normal and breath sounds normal. No wheezing, rales or rhonchi. ?Abdominal: Soft, nondistended.  Mild bilateral lower abdominal tenderness. Bowel sounds active throughout. There are no masses palpable. No hepatomegaly. ?Rectal: Deferred ?Neurological: Alert and oriented to person place and time. ?Skin: Skin is warm and dry. No rashes noted. ? ?Data Reviewed: I have personally reviewed following labs and imaging studies ? ? ? ?Carmell Austria, MD 03/17/2022, 2:14 PM ? ?Cc: Marco Collie, MD ? ? ?

## 2022-03-17 NOTE — Patient Instructions (Signed)
If you are age 62 or older, your body mass index should be between 23-30. Your Body mass index is 34.13 kg/m?Kristy Walker If this is out of the aforementioned range listed, please consider follow up with your Primary Care Provider. ? ?If you are age 62 or younger, your body mass index should be between 19-25. Your Body mass index is 34.13 kg/m?Kristy Walker If this is out of the aformentioned range listed, please consider follow up with your Primary Care Provider.  ? ?________________________________________________________ ? ?The [facility] providers would like to encourage you to use [facility] to communicate with providers for non-urgent requests or questions.  Due to long hold times on the telephone, sending your provider a message by [facility] may be a faster and more efficient way to get a response.  Please allow 48 business hours for a response.  Please remember that this is for non-urgent requests.  ?_______________________________________________________ ? ?We have given you samples of the following medication to take: ?Linzess 81mg to do every Saturday ? ?Please purchase the following medications over the counter and take as directed: ?Colace 2 tablet a day ? ?Quit smoking. ? ?Please call [facility] 6 months to schedule a follow up appointment. ? ?Thank you, ? ?Dr. RJackquline Denmark? ? ? ? ? ?We want to thank you for [facility] with [facility]. All of our staff and providers value the relationships we have built with our patients, and it is an honor to care for you.  ? ?We are writing to let you know that [facility] will [facility] on Apr [facility], 2022, and we invite you to continue to see Dr. RCarmell Austriaand VGerrit Heckat the [facility] location. We are consolidating our serices at [facility] to better provide care. Our office staff will work with you to ensure a seamless transition.  ? ?[name] DO -Dr. CBryan Lemmawill be movig [facility] at [address] [place] [id number] effective Apr 20, 2022.  Contact [phone] to schedule an appointment with him.  ? ?[name] MD- Dr. GLyndel Safewill be [facility]ig [facility] at [address] [place] [address] effective Apr 20, 2022.  Contact [phone] to schedule an appointment with him.  ? ?Requesting Medical Records ?If you need to request your medical records, please follow the instructions below. Your medical records are confidential, and a copy can be transferred to another provider or released to you or another person you designate only with your permission. ? ?There are several ways to request your medical records: ?Requests for medical records can be submitted through our practice.   ?You can also request your records electronically, in your MyChart account by selecting the ?Request Health Records? tab.  ?If you need additional information on how to request records, please go to [email or web address] choose Patient Information, then select Request Medical Records. ?To make an appointment or if you have any questions about your health care needs, please contact our office at [phone] one of our staff members will be glad to assist you. ?[name] is committed to providing exceptional care for you and our community. Thank you for allowing [facility] serve your health care needs. ?Sincerely, ? ?SWindy Canny Director [facility] ?[facility] also offers convenient virtual care options. Sore throat? Sinus problems? Cold or flu symptoms? Get care from the comfort of home with [facility] and e-Visits. Learn more about the  non-emergency conditions treated and start your virtual visit at [email or web address] ? ?

## 2022-03-19 ENCOUNTER — Telehealth: Payer: Self-pay

## 2022-03-19 NOTE — Telephone Encounter (Signed)
Linzess 57mg approved through covermymeds for 30 for 30 ? ?Approvedtoday ?CGOVPCH:[id number];YHTMBP:JPETKKOEReview Type:Prior Auth;Coverage Start Date:02/17/2022;Coverage End Date:03/19/2023 ?

## 2022-04-01 ENCOUNTER — Ambulatory Visit: Payer: No Typology Code available for payment source | Attending: Neurological Surgery | Admitting: Physical Therapy

## 2022-04-01 ENCOUNTER — Encounter: Payer: Self-pay | Admitting: Physical Therapy

## 2022-04-01 DIAGNOSIS — R2689 Other abnormalities of gait and mobility: Secondary | ICD-10-CM | POA: Diagnosis present

## 2022-04-01 DIAGNOSIS — M545 Low back pain, unspecified: Secondary | ICD-10-CM | POA: Diagnosis present

## 2022-04-01 DIAGNOSIS — M6281 Muscle weakness (generalized): Secondary | ICD-10-CM | POA: Insufficient documentation

## 2022-04-01 NOTE — Therapy (Signed)
?OUTPATIENT PHYSICAL THERAPY THORACOLUMBAR EVALUATION ? ? ?Patient Name: Kristy Walker ?MRN: [id number] ?DOB:11/13/1961, 61 y.o., female ?Today's Date: 04/01/2022 ? ? PT End of Session - 04/01/22 1347   ? ? Visit Number 1   ? Number of Visits 15   ? Date for PT Re-Evaluation 05/27/22   ? [facility] Type [facility]   ? Authorization Time Period 01/23/2022 -07/22/2022   ? PT Start Time 1247   ? PT Stop Time 3716   ? PT Time Calculation (min) 60 min   ? ?  ?  ? ?  ? ? ?Past Medical History:  ?Diagnosis Date  ? Allergy   ? Anxiety   ? Depression   ? Hyperlipidemia   ? Sleep apnea   ? cpap  ? ?Past Surgical History:  ?Procedure Laterality Date  ? COLONOSCOPY    ? TONSILLECTOMY    ? april 2016  ? ?There are no problems to display for this patient. ? ? ?PCP: Marco Collie, MD ? ?REFERRING PROVIDER: Eustace Moore, MD ? ?THERAPY DIAG:  ?Low back pain, unspecified back pain laterality, unspecified chronicity, unspecified whether sciatica present - Plan: PT plan of care cert/re-cert ? ?Muscle weakness - Plan: PT plan of care cert/re-cert ? ?Other abnormalities of gait and mobility - Plan: PT plan of care cert/re-cert ? ?REFERRING DIAG: Referral diagnosis: Radiculopathy, lumbar region [M54.16] ? ?SUBJECTIVE: ? ?PERTINENT PAST HISTORY:  ?Anxiety, sleep apnea   ?     ?PRECAUTIONS: None ? ?WEIGHT BEARING RESTRICTIONS No ? ?FALLS:  ?Has patient fallen in last 6 months? No, Number of falls: 0 ? ?LIVING ENVIRONMENT: ?Lives with: lives with their spouse ?Stairs: Yes; Internal: 2 steps; on right going up ? ?MOI/History of condition: ? ?Onset date: July 2022, ? ?Kristy Walker is a 61 y.o. female who presents to clinic with chief complaint of LBP L>R with occasional radiating sxs, not below the knees.  Pain started with flexion based lifting injury.  MRI showed disk bulge at L4-L4. She was having shooting pain to her R knee which resolved with an injection at neurologist about 2 months ago.  She went to chiro to good effect ([facility]).   She is having minimal radiating sxs at this point. ? ? Red flags:  ?denies BB changes and saddle anesthesia ? ?Pain:  ?Are you having pain? Yes ?Pain location: L>R LBP ?NPRS scale:  ?current 6/10  ?average 7/10  ?Aggravating factors: walking for longer distances (30 min), standing for long periods ? NPRS, highest: 9/10 ?Relieving factors: Ice ? NPRS: best: 3/10 ?Pain description: intermittent, constant, sharp, and aching ?Stage: Chronic ?Stability: staying the same ?24 hour pattern: loosens in up after the morning and then worse with activity  ? ?Occupation: NA ? ?Assistive Device: NA ? ?Hand Dominance: NA ? ?Patient Goals/Specific Activities: get back to baseline, fast walking (30 - 45 min) ? ? ?PLOF: Independent ? ?DIAGNOSTIC FINDINGS: Disk bulge L4-L5 ? ? ?OBJECTIVE:  ? ?GENERAL OBSERVATION/GAIT: ? Unable to toe walk d/t weakness and some pain in posterior legs to knees ? ?SENSATION: ? Light touch: Deficits toes of L foot ? ?MUSCLE LENGTH: ?Hamstrings: Right no restriction; Left no restriction ?ASLR: Right ASLR significantly reduced compared to PSLR ; Left ASLR significantly reduced compared to PSLR  ?  Ely's: (+) for tightness and LBP bil ? ?LUMBAR AROM ? ?AROM AROM  ?04/01/2022  ?Flexion WNL, w/ no pain (gowers)  ?Extension limited by 50%, w/ concordant pain  ?Right lateral flexion WNL,  w/ concordant pain  ?Left lateral flexion WNL  ?Right rotation limited by 50%  ?Left rotation limited by 50%  ?  ?(Blank rows = not tested) ? ?DIRECTIONAL PREFERENCE: ? None clear ? ?LE MMT: ? ?MMT Right ?04/01/2022 Left ?04/01/2022  ?Hip flexion (L2, L3) N N  ?Knee extension (L3) N N  ?Knee flexion N N  ?Hip abduction    ?Hip extension    ?Hip external rotation    ?Hip internal rotation    ?Hip adduction    ?Ankle dorsiflexion (L4) N N  ?Ankle plantarflexion (S1) D D  ?Ankle inversion    ?Ankle eversion    ?Great Toe ext (L5)    ?Grossly    ? ?(Blank rows = not tested, score listed is out of 5 possible points.  N = WNL, D =  diminished, C = clear for gross weakness with myotome testing, * = concordant pain with testing) ? ? ?Functional Tests ? ?Eval (04/01/2022)    ?Sustained supine bridge (dominant leg extended at 120'', if reached): NT d/t pain (norm 170'')    ?    ?    ?    ?    ?    ?    ?    ?    ?    ?    ?    ?    ?    ? ? ? LUMBAR SPECIAL TESTS:  ?Straight leg raise: L (-), R (-) ?Slump: L (-), R (-) ? ? PALPATION:  ? TTP and muscle tone lumbar paraspinals L>R with TTP and increased muscle tone L Iliocostalis and L QL ? ? SPINAL SEGMENTAL MOBILITY ASSESSMENT:  ?Hypomobile with no radiation of sxs ? ?PATIENT SURVEYS:  ?FOTO 37 -> 54 ? ? ?TODAY'S TREATMENT  ?Creating, reviewing, and completing below HEP ? ?Trigger Point Dry-Needling  ?Treatment instructions: Expect mild to moderate muscle soreness. S/S of pneumothorax if dry needled over a lung field, and to seek immediate medical attention should they occur. Patient verbalized understanding of these instructions and education. ? ?Patient Consent Given: Yes ?Education handout provided: No ?Muscles treated: lumbar multifidus, Iliocostalis lumborum ?Electrical stimulation performed: No ?Parameters: N/A ?Treatment response/outcome: reduction in muscle tone ? ? ?PATIENT EDUCATION:  ?POC, diagnosis, prognosis, HEP, and outcome measures.  Pt educated via explanation, demonstration, and handout (HEP).  Pt confirms understanding verbally.  ? ?ASTERISK SIGNS ? ? ?Asterisk Signs Eval (04/01/2022)       ?Toe walk L unable       ?pain As high as 9/10, 6/10 average       ?Distance walked <30 min with pain       ?ASLR ASLR<PSLR       ?Elys test Concordant LBP        ? ? ?HOME EXERCISE PROGRAM: ?Access Code: [email or web address] ?URL: [email or web address] ?Date: 04/01/2022 ?Prepared by: Shearon Balo ? ?Exercises ?- Supine Posterior Pelvic Tilt  - 2 x daily - 7 x weekly - 2 sets - 10 reps - 5'' hold ?- Static Prone on Elbows  - 1 x daily - 7 x weekly - 3 sets - 10  reps ? ?ASSESSMENT: ? ?CLINICAL IMPRESSION: ?Kristy Walker is a 61 y.o. female who presents to clinic with signs and sxs consistent with low back pain following lifting injury last year.  Consistent with MRI finding of disk bulge.  Minimal radiating sxs after injection within the last 2 months.  SLR and slump (-), but does have some reproduction of sxs with Elys  suggesting element of neural tension.  Directional preference not clear for ext, but this is not agging either.  Trialed TDN today and will monitor effect.   ? ?OBJECTIVE IMPAIRMENTS: Pain, lumbar ROM, gait ? ?ACTIVITY LIMITATIONS: bending, squatting, standing, walking, lifting. ? ?PERSONAL FACTORS: See medical history and pertinent history ? ? ?REHAB POTENTIAL: Good ? ?CLINICAL DECISION MAKING: Stable/uncomplicated ? ?EVALUATION COMPLEXITY: Low ? ? ?GOALS: ? ? ?SHORT TERM GOALS: ? ?Kristy Walker will be >75% HEP compliant to improve carryover between sessions and facilitate independent management of condition ? ?Evaluation (04/01/2022): ongoing ?Target date: 04/22/2022 ?Goal status: INITIAL ? ? ?LONG TERM GOALS: ? ?Kristy Walker will improve FOTO score to 54 as a proxy for functional improvement ? ?Evaluation/Baseline (04/01/2022): 37 ?Target date: 05/27/2022 ?Goal status: INITIAL ? ? ?2.  Kristy Walker will self report >/= 50% decrease in pain from evaluation  ? ?Evaluation/Baseline (04/01/2022): 9/10 max pain ?Target date: 05/27/2022 ?Goal status: INITIAL ? ? ?3.  Kristy Walker will be able to lift 15 lbs from the floor and place on a 3 foot counter, not limited by pain  ? ?Evaluation/Baseline (04/01/2022): unable ?Target date: 05/27/2022 ?Goal status: INITIAL  ? ? ?4.  Kristy Walker will be able to walk for 30-40 min, not limited by pain  ? ?Evaluation/Baseline (04/01/2022): limited by pain ?Target date: 05/27/2022 ?Goal status: INITIAL ? ? ? ?5.  Kristy Walker will be able to complete housework, not limited by pain ? ?Evaluation/Baseline (04/01/2022): limited ?Target date: 05/27/2022 ?Goal status:  INITIAL ? ? ? ?PLAN: ?PT FREQUENCY: 1-2x/week ? ?PT DURATION: 8 weeks (Ending 05/27/2022) ? ?PLANNED INTERVENTIONS: Therapeutic exercises, Aquatic therapy, Therapeutic activity, Neuro Muscular re-education, Gait training, Patient/Family education, Joint mo

## 2022-04-06 ENCOUNTER — Ambulatory Visit: Payer: No Typology Code available for payment source | Admitting: Physical Therapy

## 2022-04-09 ENCOUNTER — Ambulatory Visit: Payer: No Typology Code available for payment source

## 2022-04-09 NOTE — Therapy (Signed)
?OUTPATIENT PHYSICAL THERAPY TREATMENT NOTE ? ? ?Patient Name: Kristy Walker ?MRN: [id number] ?DOB:30-Dec-1960, 61 y.o., female ?Today's Date: 04/11/2022 ? ?PCP: Marco Collie, MD ?REFERRING PROVIDER: Eustace Moore, MD ? ?END OF SESSION:  ? PT End of Session - 04/11/22 1834   ? ? Visit Number 2   ? Date for PT Re-Evaluation 05/27/22   ? Authorization Type [facility] - FOTO   ? Authorization Time Period 01/23/2022 -07/22/2022   ? Progress Note Due on Visit 10   ? PT Start Time [phone]   ? PT Stop Time 1024   ? PT Time Calculation (min) 47 min   ? Activity Tolerance Patient tolerated treatment well   ? Behavior During Therapy [facility] for tasks assessed/performed   ? ?  ?  ? ?  ? ? ?Past Medical History:  ?Diagnosis Date  ? Allergy   ? Anxiety   ? Depression   ? Hyperlipidemia   ? Sleep apnea   ? cpap  ? ?Past Surgical History:  ?Procedure Laterality Date  ? COLONOSCOPY    ? TONSILLECTOMY    ? april 2016  ? ?There are no problems to display for this patient. ? ? ?REFERRING DIAG: Low back pain, unspecified back pain laterality, unspecified chronicity, unspecified whether sciatica present -  ? ?THERAPY DIAG:  ?Low back pain, unspecified back pain laterality, unspecified chronicity, unspecified whether sciatica present ? ?Muscle weakness ? ?Other abnormalities of gait and mobility ? ?SUBJECTIVE: Pt reports her low back pain  is 2/10 early in the day. Usually 8/10 in the evening depending on activity level. Pt reports completing her HEP every other day. Pt is sleeping in a new mattress which has been helpful. Overall, pt notes her back pain is a little better. ?  ?PERTINENT PAST HISTORY:  ?Anxiety, sleep apnea   ?     ?PRECAUTIONS: None ?  ?WEIGHT BEARING RESTRICTIONS No ?  ?FALLS:  ?Has patient fallen in last 6 months? No, Number of falls: 0 ?  ?LIVING ENVIRONMENT: ?Lives with: lives with their spouse ?Stairs: Yes; Internal: 2 steps; on right going up ?  ?MOI/History of condition: ?  ?Onset date: July 2022, ?  ?Kristy Walker is a 61 y.o.  female who presents to clinic with chief complaint of LBP L>R with occasional radiating sxs, not below the knees.  Pain started with flexion based lifting injury.  MRI showed disk bulge at L4-L4. She was having shooting pain to her R knee which resolved with an injection at neurologist about 2 months ago.  She went to chiro to good effect ([facility]).  She is having minimal radiating sxs at this point. ?  ?          Red flags:  ?denies BB changes and saddle anesthesia ?  ?Pain:  ?Are you having pain? Yes ?Pain location: L>R LBP ?NPRS scale:  ?current 6/10  ?average 7/10  ?Aggravating factors: walking for longer distances (30 min), standing for long periods ?          NPRS, highest: 9/10 ?Relieving factors: Ice ?          NPRS: best: 3/10 ?Pain description: intermittent, constant, sharp, and aching ?Stage: Chronic ?Stability: staying the same ?24 hour pattern: loosens in up after the morning and then worse with activity  ?  ?Occupation: NA ?  ?Assistive Device: NA ?  ?Hand Dominance: NA ?  ?Patient Goals/Specific Activities: get back to baseline, fast walking (30 - 45 min) ?  ?  ?  PLOF: Independent ?  ?DIAGNOSTIC FINDINGS: Disk bulge L4-L5 ?  ?  ?OBJECTIVE:  ?  ?GENERAL OBSERVATION/GAIT: ?          Unable to toe walk d/t weakness and some pain in posterior legs to knees ?  ?SENSATION: ?         Light touch: Deficits toes of L foot ?  ?MUSCLE LENGTH: ?Hamstrings: Right no restriction; Left no restriction ?ASLR: Right ASLR significantly reduced compared to PSLR ; Left ASLR significantly reduced compared to PSLR  ?                    Ely's: (+) for tightness and LBP bil ?  ?LUMBAR AROM ?  ?AROM AROM  ?04/01/2022  ?Flexion WNL, w/ no pain (gowers)  ?Extension limited by 50%, w/ concordant pain  ?Right lateral flexion WNL, w/ concordant pain  ?Left lateral flexion WNL  ?Right rotation limited by 50%  ?Left rotation limited by 50%  ?  ?(Blank rows = not tested) ?  ?DIRECTIONAL PREFERENCE: ?          None clear ?  ?LE MMT: ?   ?MMT Right ?04/01/2022 Left ?04/01/2022  ?Hip flexion (L2, L3) N N  ?Knee extension (L3) N N  ?Knee flexion N N  ?Hip abduction      ?Hip extension      ?Hip external rotation      ?Hip internal rotation      ?Hip adduction      ?Ankle dorsiflexion (L4) N N  ?Ankle plantarflexion (S1) D D  ?Ankle inversion      ?Ankle eversion      ?Great Toe ext (L5)      ?Grossly      ?  ?(Blank rows = not tested, score listed is out of 5 possible points.  N = WNL, D = diminished, C = clear for gross weakness with myotome testing, * = concordant pain with testing) ?  ?  ?Functional Tests ?  ?Eval (04/01/2022)      ?Sustained supine bridge (dominant leg extended at 120'', if reached): NT d/t pain (norm 170'')      ?       ?       ?       ?       ?       ?       ?       ?       ?       ?       ?       ?       ?       ?  ?  ? LUMBAR SPECIAL TESTS:  ?Straight leg raise: L (-), R (-) ?Slump: L (-), R (-) ?  ? PALPATION:  ?          TTP and muscle tone lumbar paraspinals L>R with TTP and increased muscle tone L Iliocostalis and L QL ?  ? SPINAL SEGMENTAL MOBILITY ASSESSMENT:  ?Hypomobile with no radiation of sxs ?  ?PATIENT SURVEYS:  ?FOTO 37 -> 54 ?  ?  ?TODAY'S TREATMENT  ?[facility] Adult PT Treatment:                                                DATE: 04/10/22 ?  Therapeutic Exercise: ? Supine Posterior Pelvic Tilt 10 reps - 5'' hold ? Static Prone on Elbows  10 reps ?Supine Lower Trunk Rotation 10 reps - 5 hold ?Supine Bridge  10 reps - 5 hold ?Seated Quadratus Lumborum Stretch in Chair  3 reps - 15 hold ?Updated HEP ?Manual Therapy: ?STM and DTM to the lumbar paraspinals and QL ? ?Trigger Point Dry-Needling  ?Treatment instructions: Expect mild to moderate muscle soreness. S/S of pneumothorax if dry needled over a lung field, and to seek immediate medical attention should they occur. Patient verbalized understanding of these instructions and education. ?  ?Patient Consent Given: Yes ?Education handout provided: No ?Muscles treated: lumbar  multifidus, Iliocostalis lumborum ?Electrical stimulation performed: No ?Parameters: N/A ?Treatment response/outcome: reduction in muscle tone ? ?Initial Eval Treatment ?Creating, reviewing, and completing below HEP ?  ?Trigger Point Dry-Needling  ?Treatment instructions: Expect mild to moderate muscle soreness. S/S of pneumothorax if dry needled over a lung field, and to seek immediate medical attention should they occur. Patient verbalized understanding of these instructions and education. ?  ?Patient Consent Given: Yes ?Education handout provided: No ?Muscles treated: lumbar multifidus, Iliocostalis lumborum ?Electrical stimulation performed: No ?Parameters: N/A ?Treatment response/outcome: reduction in muscle tone ?  ?  ?PATIENT EDUCATION:  ?POC, diagnosis, prognosis, HEP, and outcome measures.  Pt educated via explanation, demonstration, and handout (HEP).  Pt confirms understanding verbally.  ?  ?ASTERISK SIGNS ?  ?  ?Asterisk Signs Eval (04/01/2022)            ?Toe walk L unable            ?pain As high as 9/10, 6/10 average            ?Distance walked <30 min with pain            ?ASLR ASLR<PSLR            ?Elys test Concordant LBP             ?  ?  ?HOME EXERCISE PROGRAM: ? Access Code: [email or web address] ?URL: [email or web address] ?Date: 04/11/2022 ?Prepared by: Gar Ponto ? ?Exercises ?- Supine Posterior Pelvic Tilt  - 2 x daily - 7 x weekly - 2 sets - 10 reps - 5'' hold ?- Static Prone on Elbows  - 1 x daily - 7 x weekly - 3 sets - 10 reps ?- Supine Lower Trunk Rotation  - 1 x daily - 7 x weekly - 2 sets - 10 reps - 5 hold ?- Supine Bridge  - 1 x daily - 7 x weekly - 2 sets - 10 reps - 5 hold ?- Seated Quadratus Lumborum Stretch in Chair  - 1 x daily - 7 x weekly - 2 sets - 3 reps - 15 hold ?  ?ASSESSMENT: ?  ?CLINICAL IMPRESSION: ?Per pt's report her low back is feeling some better. PT was provided for manual therapy to the low back f/b TPDN. Pt then paricpated in PT for lumbosacral flexibility and  strengthening. Pt tolerated today's session without adverse effects. ?  ?OBJECTIVE IMPAIRMENTS: Pain, lumbar ROM, gait ?  ?  ?GOALS: ?  ?  ?SHORT TERM GOALS: ?  ?Jaskirat will be >75% HEP compliant to impro

## 2022-04-10 ENCOUNTER — Ambulatory Visit: Payer: No Typology Code available for payment source | Attending: Neurological Surgery

## 2022-04-10 DIAGNOSIS — R2689 Other abnormalities of gait and mobility: Secondary | ICD-10-CM | POA: Insufficient documentation

## 2022-04-10 DIAGNOSIS — M6281 Muscle weakness (generalized): Secondary | ICD-10-CM | POA: Diagnosis present

## 2022-04-10 DIAGNOSIS — M545 Low back pain, unspecified: Secondary | ICD-10-CM | POA: Insufficient documentation

## 2022-04-14 ENCOUNTER — Ambulatory Visit: Payer: No Typology Code available for payment source

## 2022-04-14 DIAGNOSIS — M6281 Muscle weakness (generalized): Secondary | ICD-10-CM

## 2022-04-14 DIAGNOSIS — M545 Low back pain, unspecified: Secondary | ICD-10-CM

## 2022-04-14 DIAGNOSIS — R2689 Other abnormalities of gait and mobility: Secondary | ICD-10-CM

## 2022-04-14 NOTE — Therapy (Signed)
?OUTPATIENT PHYSICAL THERAPY TREATMENT NOTE ? ? ?Patient Name: Kristy Walker ?MRN: [id number] ?DOB:[birth year], 61 y.o., female ?Today's Date: 04/14/2022 ? ?PCP: Marco Collie, MD ?REFERRING PROVIDER: Eustace Moore, MD ? ?END OF SESSION:  ? PT End of Session - 04/14/22 1722   ? ? Visit Number 3   ? Date for PT Re-Evaluation 05/27/22   ? Authorization Type [facility] - FOTO   ? Authorization Time Period 01/23/2022 -07/22/2022   ? Progress Note Due on Visit 10   ? PT Start Time 1636   ? PT Stop Time 1700   ? PT Time Calculation (min) 24 min   ? Activity Tolerance Patient tolerated treatment well   ? Behavior During Therapy [facility] for tasks assessed/performed   ? ?  ?  ? ?  ? ? ? ?Past Medical History:  ?Diagnosis Date  ? Allergy   ? Anxiety   ? Depression   ? Hyperlipidemia   ? Sleep apnea   ? cpap  ? ?Past Surgical History:  ?Procedure Laterality Date  ? COLONOSCOPY    ? TONSILLECTOMY    ? april 2016  ? ?There are no problems to display for this patient. ? ? ?REFERRING DIAG: Low back pain, unspecified back pain laterality, unspecified chronicity, unspecified whether sciatica present -  ? ?THERAPY DIAG:  ?Low back pain, unspecified back pain laterality, unspecified chronicity, unspecified whether sciatica present ? ?Muscle weakness ? ?Other abnormalities of gait and mobility ? ?SUBJECTIVE:  ?Pt arrives late to PT session today, notes continued L sided lower back pain. Pt is ready to begin PT at this time.  ?  ?Pain:  ?Are you having pain? Yes ?Pain location: L>R LBP ?NPRS scale:  ?current 6/10  ?average 7/10  ?Aggravating factors: walking for longer distances (30 min), standing for long periods ?          NPRS, highest: 9/10 ?Relieving factors: Ice ?          NPRS: best: 3/10 ?Pain description: intermittent, constant, sharp, and aching ?Stage: Chronic ?Stability: staying the same ?24 hour pattern: loosens in up after the morning and then worse with activity  ?  ?Occupation: NA ?  ?Assistive Device: NA ?  ?Hand Dominance: NA ?   ?Patient Goals/Specific Activities: get back to baseline, fast walking (30 - 45 min) ?  ?  ?PLOF: Independent ?  ?DIAGNOSTIC FINDINGS: Disk bulge L4-L5 ?  ?  ?OBJECTIVE:  ?  ?GENERAL OBSERVATION/GAIT: ?          Unable to toe walk d/t weakness and some pain in posterior legs to knees ?  ?SENSATION: ?         Light touch: Deficits toes of L foot ?  ?MUSCLE LENGTH: ?Hamstrings: Right no restriction; Left no restriction ?ASLR: Right ASLR significantly reduced compared to PSLR ; Left ASLR significantly reduced compared to PSLR  ?                    Ely's: (+) for tightness and LBP bil ?  ?LUMBAR AROM ?  ?AROM AROM  ?04/01/2022  ?Flexion WNL, w/ no pain (gowers)  ?Extension limited by 50%, w/ concordant pain  ?Right lateral flexion WNL, w/ concordant pain  ?Left lateral flexion WNL  ?Right rotation limited by 50%  ?Left rotation limited by 50%  ?  ?(Blank rows = not tested) ?  ?DIRECTIONAL PREFERENCE: ?          None clear ?  ?LE MMT: ?  ?MMT  Right ?04/01/2022 Left ?04/01/2022  ?Hip flexion (L2, L3) N N  ?Knee extension (L3) N N  ?Knee flexion N N  ?Hip abduction      ?Hip extension      ?Hip external rotation      ?Hip internal rotation      ?Hip adduction      ?Ankle dorsiflexion (L4) N N  ?Ankle plantarflexion (S1) D D  ?Ankle inversion      ?Ankle eversion      ?Great Toe ext (L5)      ?Grossly      ?  ?(Blank rows = not tested, score listed is out of 5 possible points.  N = WNL, D = diminished, C = clear for gross weakness with myotome testing, * = concordant pain with testing) ?  ?  ?Functional Tests ?  ?Eval (04/01/2022)      ?Sustained supine bridge (dominant leg extended at 120'', if reached): NT d/t pain (norm 170'')      ?       ?       ?       ?       ?       ?       ?       ?       ?       ?       ?       ?       ?       ?  ?  ? LUMBAR SPECIAL TESTS:  ?Straight leg raise: L (-), R (-) ?Slump: L (-), R (-) ?  ? PALPATION:  ?          TTP and muscle tone lumbar paraspinals L>R with TTP and increased muscle tone L  Iliocostalis and L QL ?  ? SPINAL SEGMENTAL MOBILITY ASSESSMENT:  ?Hypomobile with no radiation of sxs ?  ?PATIENT SURVEYS:  ?FOTO 37 -> 54 ?  ?  ?TODAY'S TREATMENT  ?[facility] Adult PT Treatment:                                                DATE: 04/14/2022 ?Therapeutic Exercise: ?Supine PPT x 10 - 5" hold ?Bridge x 15 - 3" hold ?90/90 table top 4x15" ?Manual Therapy: ?STM and DTM to the lumbar paraspinals and QL ?Grade V mid lumbar roll manipulation  ?Trigger Point Dry-Needling  ?Treatment instructions: Expect mild to moderate muscle soreness. S/S of pneumothorax if dry needled over a lung field, and to seek immediate medical attention should they occur. Patient verbalized understanding of these instructions and education. ?  ?Patient Consent Given: Yes ?Education handout provided: No ?Muscles treated: lumbar multifidus, Iliocostalis lumborum ?Electrical stimulation performed: No ?Parameters: N/A ?Treatment response/outcome: reduction in muscle tone ? ?[facility] Adult PT Treatment:                                                DATE: 04/10/2022 ?Therapeutic Exercise: ? Supine Posterior Pelvic Tilt 10 reps - 5'' hold ? Static Prone on Elbows  10 reps ?Supine Lower Trunk Rotation 10 reps - 5 hold ?Supine Bridge  10 reps - 5 hold ?Seated Quadratus Lumborum  Stretch in Chair  3 reps - 15 hold ?Updated HEP ?Manual Therapy: ?STM and DTM to the lumbar paraspinals and QL ?Trigger Point Dry-Needling  ?Treatment instructions: Expect mild to moderate muscle soreness. S/S of pneumothorax if dry needled over a lung field, and to seek immediate medical attention should they occur. Patient verbalized understanding of these instructions and education. ?  ?Patient Consent Given: Yes ?Education handout provided: No ?Muscles treated: lumbar multifidus, Iliocostalis lumborum ?Electrical stimulation performed: No ?Parameters: N/A ?Treatment response/outcome: reduction in muscle tone ? ? ?PATIENT EDUCATION:  ?POC, diagnosis, prognosis, HEP, and  outcome measures.  Pt educated via explanation, demonstration, and handout (HEP).  Pt confirms understanding verbally.  ?  ?ASTERISK SIGNS ?  ?  ?Asterisk Signs Eval (04/01/2022)            ?Toe walk L unable            ?pain As high as 9/10, 6/10 average            ?Distance walked <30 min with pain            ?ASLR ASLR<PSLR            ?Elys test Concordant LBP             ?  ?  ?HOME EXERCISE PROGRAM: ? Access Code: [email or web address] ?URL: [email or web address] ?Date: 04/11/2022 ?Prepared by: Gar Ponto ? ?Exercises ?- Supine Posterior Pelvic Tilt  - 2 x daily - 7 x weekly - 2 sets - 10 reps - 5'' hold ?- Static Prone on Elbows  - 1 x daily - 7 x weekly - 3 sets - 10 reps ?- Supine Lower Trunk Rotation  - 1 x daily - 7 x weekly - 2 sets - 10 reps - 5 hold ?- Supine Bridge  - 1 x daily - 7 x weekly - 2 sets - 10 reps - 5 hold ?- Seated Quadratus Lumborum Stretch in Chair  - 1 x daily - 7 x weekly - 2 sets - 3 reps - 15 hold ?  ?ASSESSMENT: ?  ?CLINICAL IMPRESSION: ?Pt was able to complete prescribed exercises with no adverse effect and responded well to manual therapy interventions. She continues to benefit from skilled PT services and will continue to be seen and progressed as able per POC.  ?  ?OBJECTIVE IMPAIRMENTS: Pain, lumbar ROM, gait ?  ?  ?GOALS: ?  ?  ?SHORT TERM GOALS: ?  ?Kristy Walker will be >75% HEP compliant to improve carryover between sessions and facilitate independent management of condition ?  ?Evaluation (04/01/2022): ongoing ?Target date: 04/22/2022 ?Goal status: INITIAL ?  ?  ?LONG TERM GOALS: ?  ?Kristy Walker will improve FOTO score to 54 as a proxy for functional improvement ?  ?Evaluation/Baseline (04/01/2022): 37 ?Target date: 05/27/2022 ?Goal status: INITIAL ?  ?  ?2.  Kristy Walker will self report >/= 50% decrease in pain from evaluation  ?  ?Evaluation/Baseline (04/01/2022): 9/10 max pain ?Target date: 05/27/2022 ?Goal status: INITIAL ?  ?  ?3.  Kristy Walker will be able to lift 15 lbs from the floor and place on  a 3 foot counter, not limited by pain  ?  ?Evaluation/Baseline (04/01/2022): unable ?Target date: 05/27/2022 ?Goal status: INITIAL  ?  ?  ?4.  Kristy Walker will be able to walk for 30-40 min, not limited by pain  ?  ?

## 2022-04-16 ENCOUNTER — Ambulatory Visit: Payer: No Typology Code available for payment source | Admitting: Physical Therapy

## 2022-04-16 ENCOUNTER — Encounter: Payer: Self-pay | Admitting: Physical Therapy

## 2022-04-16 DIAGNOSIS — M545 Low back pain, unspecified: Secondary | ICD-10-CM | POA: Diagnosis not present

## 2022-04-16 DIAGNOSIS — M6281 Muscle weakness (generalized): Secondary | ICD-10-CM

## 2022-04-16 DIAGNOSIS — R2689 Other abnormalities of gait and mobility: Secondary | ICD-10-CM

## 2022-04-16 NOTE — Therapy (Signed)
?OUTPATIENT PHYSICAL THERAPY TREATMENT NOTE ? ? ?Patient Name: Kristy Walker ?MRN: [id number] ?DOB:October 22, 1961, 61 y.o., female ?Today's Date: 04/16/2022 ? ?PCP: Marco Collie, MD ?REFERRING PROVIDER: Eustace Moore, MD ? ?END OF SESSION:  ? PT End of Session - 04/16/22 1527   ? ? Visit Number 4   ? Date for PT Re-Evaluation 05/27/22   ? Authorization Type [facility] - FOTO   ? Authorization Time Period 01/23/2022 -07/22/2022   ? Progress Note Due on Visit 10   ? PT Start Time 1530   ? PT Stop Time 1612   ? PT Time Calculation (min) 42 min   ? Activity Tolerance Patient tolerated treatment well   ? Behavior During Therapy [facility] for tasks assessed/performed   ? ?  ?  ? ?  ? ? ? ?Past Medical History:  ?Diagnosis Date  ? Allergy   ? Anxiety   ? Depression   ? Hyperlipidemia   ? Sleep apnea   ? cpap  ? ?Past Surgical History:  ?Procedure Laterality Date  ? COLONOSCOPY    ? TONSILLECTOMY    ? april 2016  ? ?There are no problems to display for this patient. ? ? ?REFERRING DIAG: Low back pain, unspecified back pain laterality, unspecified chronicity, unspecified whether sciatica present -  ? ?THERAPY DIAG:  ?Low back pain, unspecified back pain laterality, unspecified chronicity, unspecified whether sciatica present ? ?Muscle weakness ? ?Other abnormalities of gait and mobility ? ?SUBJECTIVE:  ?Pt reports that the TDN has been helpful. She feels that the grade 5 manipulation was minimally helpful. ?Pain:  ?Are you having pain? Yes ?Pain location: L>R LBP ?NPRS scale:  ?current 6/10  ?average 7/10  ?Aggravating factors: walking for longer distances (30 min), standing for long periods ?Relieving factors: Ice ?Pain description: intermittent, constant, sharp, and aching ?Stage: Chronic ?Stability: staying the same ?24 hour pattern: loosens in up after the morning and then worse with activity  ?  ?Occupation: NA ?  ?Assistive Device: NA ?  ?Hand Dominance: NA ?  ?Patient Goals/Specific Activities: get back to baseline, fast walking (30 - 45  min) ?  ?  ?PLOF: Independent ?  ?DIAGNOSTIC FINDINGS: Disk bulge L4-L5 ?  ?  ?OBJECTIVE:  ?  ?GENERAL OBSERVATION/GAIT: ?          Unable to toe walk d/t weakness and some pain in posterior legs to knees ?  ?SENSATION: ?         Light touch: Deficits toes of L foot ?  ?MUSCLE LENGTH: ?Hamstrings: Right no restriction; Left no restriction ?ASLR: Right ASLR significantly reduced compared to PSLR ; Left ASLR significantly reduced compared to PSLR  ?                    Ely's: (+) for tightness and LBP bil ?  ?LUMBAR AROM ?  ?AROM AROM  ?04/01/2022  ?Flexion WNL, w/ no pain (gowers)  ?Extension limited by 50%, w/ concordant pain  ?Right lateral flexion WNL, w/ concordant pain  ?Left lateral flexion WNL  ?Right rotation limited by 50%  ?Left rotation limited by 50%  ?  ?(Blank rows = not tested) ?  ?DIRECTIONAL PREFERENCE: ?          None clear ?  ?LE MMT: ?  ?MMT Right ?04/01/2022 Left ?04/01/2022  ?Hip flexion (L2, L3) N N  ?Knee extension (L3) N N  ?Knee flexion N N  ?Hip abduction      ?Hip extension      ?  Hip external rotation      ?Hip internal rotation      ?Hip adduction      ?Ankle dorsiflexion (L4) N N  ?Ankle plantarflexion (S1) D D  ?Ankle inversion      ?Ankle eversion      ?Great Toe ext (L5)      ?Grossly      ?  ?(Blank rows = not tested, score listed is out of 5 possible points.  N = WNL, D = diminished, C = clear for gross weakness with myotome testing, * = concordant pain with testing) ?  ?  ?Functional Tests ?  ?Eval (04/01/2022)      ?Sustained supine bridge (dominant leg extended at 120'', if reached): NT d/t pain (norm 170'')      ?       ?       ?       ?       ?       ?       ?       ?       ?       ?       ?       ?       ?       ?  ?  ? LUMBAR SPECIAL TESTS:  ?Straight leg raise: L (-), R (-) ?Slump: L (-), R (-) ?  ? PALPATION:  ?          TTP and muscle tone lumbar paraspinals L>R with TTP and increased muscle tone L Iliocostalis and L QL ?  ? SPINAL SEGMENTAL MOBILITY ASSESSMENT:  ?Hypomobile  with no radiation of sxs ?  ?PATIENT SURVEYS:  ?FOTO 37 -> 54 ?  ?  ?ASTERISK SIGNS ?  ?  ?Asterisk Signs Eval (04/01/2022)            ?Toe walk L unable            ?pain As high as 9/10, 6/10 average            ?Distance walked <30 min with pain            ?ASLR ASLR<PSLR            ?Elys test Concordant LBP             ?  ?  ?HOME EXERCISE PROGRAM: ? Access Code: [email or web address] ?URL: [email or web address] ?Date: 04/11/2022 ?Prepared by: Gar Ponto ? ?Exercises ?- Supine Posterior Pelvic Tilt  - 2 x daily - 7 x weekly - 2 sets - 10 reps - 5'' hold ?- Static Prone on Elbows  - 1 x daily - 7 x weekly - 3 sets - 10 reps ?- Supine Lower Trunk Rotation  - 1 x daily - 7 x weekly - 2 sets - 10 reps - 5 hold ?- Supine Bridge  - 1 x daily - 7 x weekly - 2 sets - 10 reps - 5 hold ?- Seated Quadratus Lumborum Stretch in Chair  - 1 x daily - 7 x weekly - 2 sets - 3 reps - 15 hold ? ?TODAY'S TREATMENT  ? ?[facility] Adult PT Treatment:                                                DATE: 04/16/2022 ?  Therapeutic Exercise: ?Supine PPT x 10 - 5" hold ?LTR - 10x ?Hip adduction squeeze - 5'' squeeze 10x ?Alternating hip abduction - GTB - 10x ea ? ?Manual Therapy: ?STM and DTM to the lumbar paraspinals and QL ?Manual QL stretch, pt in R S/L ? ?Trigger Point Dry-Needling  ?Treatment instructions: Expect mild to moderate muscle soreness. S/S of pneumothorax if dry needled over a lung field, and to seek immediate medical attention should they occur. Patient verbalized understanding of these instructions and education. ?  ?Patient Consent Given: Yes ?Education handout provided: No ?Muscles treated: lumbar multifidus, Iliocostalis lumborum ?Electrical stimulation performed: YES ?Parameters: 5 min low frequency - milli amps - low intensity, 5 min - micro amps - high frequency high intensity ?Treatment response/outcome: reduction in muscle tone ? ?[facility] Adult PT Treatment:                                                DATE:  04/14/2022 ?Therapeutic Exercise: ?Supine PPT x 10 - 5" hold ?Bridge x 15 - 3" hold ?90/90 table top 4x15" ?Manual Therapy: ?STM and DTM to the lumbar paraspinals and QL ?Grade V mid lumbar roll manipulation  ?Trigger Point Dry-Needling  ?Treatment instructions: Expect mild to moderate muscle soreness. S/S of pneumothorax if dry needled over a lung field, and to seek immediate medical attention should they occur. Patient verbalized understanding of these instructions and education. ?  ?Patient Consent Given: Yes ?Education handout provided: No ?Muscles treated: lumbar multifidus, Iliocostalis lumborum ?Electrical stimulation performed: No ?Parameters: N/A ?Treatment response/outcome: reduction in muscle tone ? ?[facility] Adult PT Treatment:                                                DATE: 04/10/2022 ?Therapeutic Exercise: ? Supine Posterior Pelvic Tilt 10 reps - 5'' hold ? Static Prone on Elbows  10 reps ?Supine Lower Trunk Rotation 10 reps - 5 hold ?Supine Bridge  10 reps - 5 hold ?Seated Quadratus Lumborum Stretch in Chair  3 reps - 15 hold ?Updated HEP ?Manual Therapy: ?STM and DTM to the lumbar paraspinals and QL ?Trigger Point Dry-Needling  ?Treatment instructions: Expect mild to moderate muscle soreness. S/S of pneumothorax if dry needled over a lung field, and to seek immediate medical attention should they occur. Patient verbalized understanding of these instructions and education. ?  ?Patient Consent Given: Yes ?Education handout provided: No ?Muscles treated: lumbar multifidus, Iliocostalis lumborum ?Electrical stimulation performed: No ?Parameters: N/A ?Treatment response/outcome: reduction in muscle tone ?  ? ?  ?ASSESSMENT: ?  ?CLINICAL IMPRESSION: ?Pt responded well to manual and attended estim with a reduction in pain from 6/10 to 3/10.  We will progress hip and core strengthening as tolerated.  ?  ?OBJECTIVE IMPAIRMENTS: Pain, lumbar ROM, gait ?  ?  ?GOALS: ?  ?  ?SHORT TERM GOALS: ?  ?Tieshia will be >75%  HEP compliant to improve carryover between sessions and facilitate independent management of condition ?  ?Evaluation (04/01/2022): ongoing ?Target date: 04/22/2022 ?Goal status: 04/16/22 MET ?  ?  ?LONG TERM GOALS: ?

## 2022-04-22 ENCOUNTER — Ambulatory Visit: Payer: No Typology Code available for payment source | Admitting: Physical Therapy

## 2022-04-22 ENCOUNTER — Encounter: Payer: Self-pay | Admitting: Physical Therapy

## 2022-04-22 DIAGNOSIS — R2689 Other abnormalities of gait and mobility: Secondary | ICD-10-CM

## 2022-04-22 DIAGNOSIS — M545 Low back pain, unspecified: Secondary | ICD-10-CM

## 2022-04-22 DIAGNOSIS — M6281 Muscle weakness (generalized): Secondary | ICD-10-CM

## 2022-04-22 NOTE — Therapy (Signed)
?OUTPATIENT PHYSICAL THERAPY TREATMENT NOTE ? ? ?Patient Name: Kristy Walker ?MRN: [id number] ?DOB:1961/02/13, 61 y.o., female ?Today's Date: 04/22/2022 ? ?PCP: Marco Collie, MD ?REFERRING PROVIDER: Eustace Moore, MD ? ?END OF SESSION:  ? PT End of Session - 04/22/22 1213   ? ? Visit Number 5   ? Date for PT Re-Evaluation 05/27/22   ? Authorization Type [facility] - FOTO   ? Authorization Time Period 01/23/2022 -07/22/2022   ? Progress Note Due on Visit 10   ? PT Start Time 1215   ? PT Stop Time 1258   ? PT Time Calculation (min) 43 min   ? Activity Tolerance Patient tolerated treatment well   ? Behavior During Therapy [facility] for tasks assessed/performed   ? ?  ?  ? ?  ? ? ? ?Past Medical History:  ?Diagnosis Date  ? Allergy   ? Anxiety   ? Depression   ? Hyperlipidemia   ? Sleep apnea   ? cpap  ? ?Past Surgical History:  ?Procedure Laterality Date  ? COLONOSCOPY    ? TONSILLECTOMY    ? april 2016  ? ?There are no problems to display for this patient. ? ? ?REFERRING DIAG: Low back pain, unspecified back pain laterality, unspecified chronicity, unspecified whether sciatica present -  ? ?THERAPY DIAG:  ?Low back pain, unspecified back pain laterality, unspecified chronicity, unspecified whether sciatica present ? ?Other abnormalities of gait and mobility ? ?Muscle weakness ? ?SUBJECTIVE:  ?Pt reports that the TDN has been helpful. She got an H-wave unit which she is unsure is helpful or not. ? ?Are you having pain? Yes ?Pain location: L>R LBP ?NPRS scale:  ?current 5/10  ?Aggravating factors: walking for longer distances (30 min), standing for long periods ?Relieving factors: Ice ?Pain description: intermittent, constant, sharp, and aching ?Stage: Chronic ?Stability: staying the same ?24 hour pattern: loosens in up after the morning and then worse with activity  ?  ?Occupation: NA ?  ?Assistive Device: NA ?  ?Hand Dominance: NA ?  ?Patient Goals/Specific Activities: get back to baseline, fast walking (30 - 45 min) ?  ?  ?PLOF:  Independent ?  ?DIAGNOSTIC FINDINGS: Disk bulge L4-L5 ?  ?  ?OBJECTIVE:  ?  ?GENERAL OBSERVATION/GAIT: ?          Unable to toe walk d/t weakness and some pain in posterior legs to knees ?  ?SENSATION: ?         Light touch: Deficits toes of L foot ?  ?MUSCLE LENGTH: ?Hamstrings: Right no restriction; Left no restriction ?ASLR: Right ASLR significantly reduced compared to PSLR ; Left ASLR significantly reduced compared to PSLR  ?                    Ely's: (+) for tightness and LBP bil ?  ?LUMBAR AROM ?  ?AROM AROM  ?04/01/2022  ?Flexion WNL, w/ no pain (gowers)  ?Extension limited by 50%, w/ concordant pain  ?Right lateral flexion WNL, w/ concordant pain  ?Left lateral flexion WNL  ?Right rotation limited by 50%  ?Left rotation limited by 50%  ?  ?(Blank rows = not tested) ?  ?DIRECTIONAL PREFERENCE: ?          None clear ?  ?LE MMT: ?  ?MMT Right ?04/01/2022 Left ?04/01/2022  ?Hip flexion (L2, L3) N N  ?Knee extension (L3) N N  ?Knee flexion N N  ?Hip abduction      ?Hip extension      ?  Hip external rotation      ?Hip internal rotation      ?Hip adduction      ?Ankle dorsiflexion (L4) N N  ?Ankle plantarflexion (S1) D D  ?Ankle inversion      ?Ankle eversion      ?Great Toe ext (L5)      ?Grossly      ?  ?(Blank rows = not tested, score listed is out of 5 possible points.  N = WNL, D = diminished, C = clear for gross weakness with myotome testing, * = concordant pain with testing) ?  ?  ?Functional Tests ?  ?Eval (04/01/2022)      ?Sustained supine bridge (dominant leg extended at 120'', if reached): NT d/t pain (norm 170'')      ?       ?       ?       ?       ?       ?       ?       ?       ?       ?       ?       ?       ?       ?  ?  ? LUMBAR SPECIAL TESTS:  ?Straight leg raise: L (-), R (-) ?Slump: L (-), R (-) ?  ? PALPATION:  ?          TTP and muscle tone lumbar paraspinals L>R with TTP and increased muscle tone L Iliocostalis and L QL ?  ? SPINAL SEGMENTAL MOBILITY ASSESSMENT:  ?Hypomobile with no radiation of  sxs ?  ?PATIENT SURVEYS:  ?FOTO 37 -> 54 ?  ?  ?ASTERISK SIGNS ?  ?  ?Asterisk Signs Eval (04/01/2022)            ?Toe walk L unable            ?pain As high as 9/10, 6/10 average            ?Distance walked <30 min with pain            ?ASLR ASLR<PSLR            ?Elys test Concordant LBP             ?  ?  ?HOME EXERCISE PROGRAM: ? Access Code: [email or web address] ?URL: [email or web address] ?Date: 04/22/2022 ?Prepared by: Shearon Balo ? ?Exercises ?- Supine Posterior Pelvic Tilt  - 2 x daily - 7 x weekly - 2 sets - 10 reps - 5'' hold ?- Static Prone on Elbows  - 1 x daily - 7 x weekly - 3 sets - 10 reps ?- Supine Lower Trunk Rotation  - 1 x daily - 7 x weekly - 2 sets - 10 reps - 5 hold ?- Supine Bridge  - 1 x daily - 7 x weekly - 2 sets - 10 reps - 5 hold ?- Supine Hip Adduction Isometric with Ball  - 1 x daily - 7 x weekly - 2 sets - 10 reps - 10'' hold ?- Hooklying Clamshell with Resistance  - 1 x daily - 7 x weekly - 3 sets - 10 reps ? ?TODAY'S TREATMENT  ? ?[facility] Adult PT Treatment:  DATE: 04/22/2022 ?Therapeutic Exercise: ?  ?Supine PPT 2 x 10 hold with foam roller ?LTR - 10x ?LE X over - 10x ea ? ?Manual Therapy: ?STM and DTM to the lumbar paraspinals and QL ?Manual QL stretch, pt in R S/L ? ?Trigger Point Dry-Needling  ?Treatment instructions: Expect mild to moderate muscle soreness. S/S of pneumothorax if dry needled over a lung field, and to seek immediate medical attention should they occur. Patient verbalized understanding of these instructions and education. ?  ?Patient Consent Given: Yes ?Education handout provided: No ?Muscles treated: lumbar multifidus, Iliocostalis lumborum ?Electrical stimulation performed: YES ?Parameters: 5 min low frequency - milli amps - low intensity, 5 min - micro amps - high frequency high intensity ?Treatment response/outcome: reduction in muscle tone ? ?[facility] Adult PT Treatment:                                                 DATE: 04/16/2022 ?Therapeutic Exercise: ?Supine PPT x 10 - 5" hold ?LTR - 10x ?Hip adduction squeeze - 5'' squeeze 10x ?Alternating hip abduction - GTB - 10x ea ? ?Manual Therapy: ?STM and DTM to the lumbar paraspinals and QL ?Manual QL stretch, pt in R S/L ? ?Trigger Point Dry-Needling  ?Treatment instructions: Expect mild to moderate muscle soreness. S/S of pneumothorax if dry needled over a lung field, and to seek immediate medical attention should they occur. Patient verbalized understanding of these instructions and education. ?  ?Patient Consent Given: Yes ?Education handout provided: No ?Muscles treated: lumbar multifidus, Iliocostalis lumborum ?Electrical stimulation performed: YES ?Parameters: 5 min low frequency - milli amps - low intensity, 5 min - micro amps - high frequency high intensity ?Treatment response/outcome: reduction in muscle tone ? ?[facility] Adult PT Treatment:                                                DATE: 04/14/2022 ?Therapeutic Exercise: ?Supine PPT x 10 - 5" hold ?Bridge x 15 - 3" hold ?90/90 table top 4x15" ?Manual Therapy: ?STM and DTM to the lumbar paraspinals and QL ?Grade V mid lumbar roll manipulation  ?Trigger Point Dry-Needling  ?Treatment instructions: Expect mild to moderate muscle soreness. S/S of pneumothorax if dry needled over a lung field, and to seek immediate medical attention should they occur. Patient verbalized understanding of these instructions and education. ?  ?Patient Consent Given: Yes ?Education handout provided: No ?Muscles treated: lumbar multifidus, Iliocostalis lumborum ?Electrical stimulation performed: No ?Parameters: N/A ?Treatment response/outcome: reduction in muscle tone ? ?[facility] Adult PT Treatment:                                                DATE: 04/10/2022 ?Therapeutic Exercise: ? Supine Posterior Pelvic Tilt 10 reps - 5'' hold ? Static Prone on Elbows  10 reps ?Supine Lower Trunk Rotation 10 reps - 5 hold ?Supine Bridge  10 reps - 5 hold ?Seated  Quadratus Lumborum Stretch in Chair  3 reps - 15 hold ?Updated HEP ?Manual Therapy: ?STM and DTM to the lumbar paraspinals and QL ?Trigger Point Dry-Needling  ?Treatment instructions: Expect  mild to mod

## 2022-04-24 ENCOUNTER — Ambulatory Visit: Payer: No Typology Code available for payment source | Admitting: Physical Therapy

## 2022-04-24 ENCOUNTER — Encounter: Payer: Self-pay | Admitting: Physical Therapy

## 2022-04-24 DIAGNOSIS — M545 Low back pain, unspecified: Secondary | ICD-10-CM | POA: Diagnosis not present

## 2022-04-24 DIAGNOSIS — M6281 Muscle weakness (generalized): Secondary | ICD-10-CM

## 2022-04-24 DIAGNOSIS — R2689 Other abnormalities of gait and mobility: Secondary | ICD-10-CM

## 2022-04-24 NOTE — Therapy (Signed)
OUTPATIENT PHYSICAL THERAPY TREATMENT NOTE   Patient Name: Kristy Walker MRN: [id number] DOB:08/02/1961, 61 y.o., female Today's Date: 04/24/2022  PCP: Marco Collie, MD REFERRING PROVIDER: Eustace Moore, MD  END OF SESSION:   PT End of Session - 04/24/22 1257     Visit Number 6    Number of Visits 15    Date for PT Re-Evaluation 05/27/22    Authorization Type [facility] - FOTO    Authorization Time Period 01/23/2022 -07/22/2022    Progress Note Due on Visit 10    PT Start Time 1300    PT Stop Time 1340    PT Time Calculation (min) 40 min    Activity Tolerance Patient tolerated treatment well    Behavior During Therapy [facility] for tasks assessed/performed              Past Medical History:  Diagnosis Date   Allergy    Anxiety    Depression    Hyperlipidemia    Sleep apnea    cpap   Past Surgical History:  Procedure Laterality Date   COLONOSCOPY     TONSILLECTOMY     april 2016   There are no problems to display for this patient.   REFERRING DIAG: Low back pain, unspecified back pain laterality, unspecified chronicity, unspecified whether sciatica present -   THERAPY DIAG:  Low back pain, unspecified back pain laterality, unspecified chronicity, unspecified whether sciatica present  Other abnormalities of gait and mobility  Muscle weakness  SUBJECTIVE:  Pt reports that she is seeing improvement with dry needling.  She does not feel her home e-stim unit is very helpful.  Are you having pain? Yes Pain location: L>R LBP NPRS scale:  current 3/10  Aggravating factors: walking for longer distances (30 min), standing for long periods Relieving factors: Ice Pain description: intermittent, constant, sharp, and aching Stage: Chronic Stability: staying the same 24 hour pattern: loosens in up after the morning and then worse with activity    Occupation: NA   Assistive Device: NA   Hand Dominance: NA   Patient Goals/Specific Activities: get back to baseline, fast  walking (30 - 45 min)     PLOF: Independent   DIAGNOSTIC FINDINGS: Disk bulge L4-L5     OBJECTIVE:    GENERAL OBSERVATION/GAIT:           Unable to toe walk d/t weakness and some pain in posterior legs to knees   SENSATION:          Light touch: Deficits toes of L foot   MUSCLE LENGTH: Hamstrings: Right no restriction; Left no restriction ASLR: Right ASLR significantly reduced compared to PSLR ; Left ASLR significantly reduced compared to PSLR                      Ely's: (+) for tightness and LBP bil   LUMBAR AROM   AROM AROM  04/01/2022  Flexion WNL, w/ no pain (gowers)  Extension limited by 50%, w/ concordant pain  Right lateral flexion WNL, w/ concordant pain  Left lateral flexion WNL  Right rotation limited by 50%  Left rotation limited by 50%    (Blank rows = not tested)   DIRECTIONAL PREFERENCE:           None clear   LE MMT:   MMT Right 04/01/2022 Left 04/01/2022  Hip flexion (L2, L3) N N  Knee extension (L3) N N  Knee flexion N N  Hip abduction  Hip extension      Hip external rotation      Hip internal rotation      Hip adduction      Ankle dorsiflexion (L4) N N  Ankle plantarflexion (S1) D D  Ankle inversion      Ankle eversion      Great Toe ext (L5)      Grossly        (Blank rows = not tested, score listed is out of 5 possible points.  N = WNL, D = diminished, C = clear for gross weakness with myotome testing, * = concordant pain with testing)     Functional Tests   Eval (04/01/2022)      Sustained supine bridge (dominant leg extended at 120'', if reached): NT d/t pain (norm 170'')                                                                                                      LUMBAR SPECIAL TESTS:  Straight leg raise: L (-), R (-) Slump: L (-), R (-)    PALPATION:            TTP and muscle tone lumbar paraspinals L>R with TTP and increased muscle tone L Iliocostalis and L QL    SPINAL SEGMENTAL MOBILITY  ASSESSMENT:  Hypomobile with no radiation of sxs   PATIENT SURVEYS:  FOTO 37 -> 54     ASTERISK SIGNS     Asterisk Signs Eval (04/01/2022) 5/19           Toe walk L unable            pain As high as 9/10, 6/10 average 4/10 average           Distance walked <30 min with pain            ASLR ASLR<PSLR            Elys test Concordant LBP                 HOME EXERCISE PROGRAM:  Access Code: [email or web address] URL: [email or web address] Date: 04/22/2022 Prepared by: Shearon Balo  Exercises - Supine Posterior Pelvic Tilt  - 2 x daily - 7 x weekly - 2 sets - 10 reps - 5'' hold - Static Prone on Elbows  - 1 x daily - 7 x weekly - 3 sets - 10 reps - Supine Lower Trunk Rotation  - 1 x daily - 7 x weekly - 2 sets - 10 reps - 5 hold - Supine Bridge  - 1 x daily - 7 x weekly - 2 sets - 10 reps - 5 hold - Supine Hip Adduction Isometric with Ball  - 1 x daily - 7 x weekly - 2 sets - 10 reps - 10'' hold - Hooklying Clamshell with Resistance  - 1 x daily - 7 x weekly - 3 sets - 10 reps  TODAY'S TREATMENT   [facility] Adult PT Treatment:  DATE: 04/22/2022 Therapeutic Exercise:   Nustep 5 min for warm up while taking subjective SLR from foam roller - 2x10 ea Bird dog - 5'' hold - 10x Isometric abdominal squeeze - 2x10 - 3'' squeeze  Manual Therapy: STM and DTM to the lumbar paraspinals and QL Skilled palpation to identify trigger points for TDN  Trigger Point Dry-Needling  Treatment instructions: Expect mild to moderate muscle soreness. S/S of pneumothorax if dry needled over a lung field, and to seek immediate medical attention should they occur. Patient verbalized understanding of these instructions and education.   Patient Consent Given: Yes Education handout provided: No Muscles treated: lumbar multifidus, Iliocostalis lumborum Electrical stimulation performed: YES Parameters: 5 min low frequency - milli amps - low intensity, 5 min -  micro amps - high frequency high intensity Treatment response/outcome: reduction in muscle tone  [facility] Adult PT Treatment:                                                DATE: 04/22/2022 Therapeutic Exercise:   Supine PPT 2 x 10 hold with foam roller LTR - 10x LE X over - 10x ea  Manual Therapy: STM and DTM to the lumbar paraspinals and QL Manual QL stretch, pt in R S/L  Trigger Point Dry-Needling  Treatment instructions: Expect mild to moderate muscle soreness. S/S of pneumothorax if dry needled over a lung field, and to seek immediate medical attention should they occur. Patient verbalized understanding of these instructions and education.   Patient Consent Given: Yes Education handout provided: No Muscles treated: lumbar multifidus, Iliocostalis lumborum Electrical stimulation performed: YES Parameters: 5 min low frequency - milli amps - low intensity, 5 min - micro amps - high frequency high intensity Treatment response/outcome: reduction in muscle tone  [facility] Adult PT Treatment:                                                DATE: 04/16/2022 Therapeutic Exercise: Supine PPT x 10 - 5" hold LTR - 10x Hip adduction squeeze - 5'' squeeze 10x Alternating hip abduction - GTB - 10x ea  Manual Therapy: STM and DTM to the lumbar paraspinals and QL Manual QL stretch, pt in R S/L  Trigger Point Dry-Needling  Treatment instructions: Expect mild to moderate muscle soreness. S/S of pneumothorax if dry needled over a lung field, and to seek immediate medical attention should they occur. Patient verbalized understanding of these instructions and education.   Patient Consent Given: Yes Education handout provided: No Muscles treated: lumbar multifidus, Iliocostalis lumborum Electrical stimulation performed: YES Parameters: 5 min low frequency - milli amps - low intensity, 5 min - micro amps - high frequency high intensity Treatment response/outcome: reduction in muscle tone  [facility] Adult PT  Treatment:                                                DATE: 04/14/2022 Therapeutic Exercise: Supine PPT x 10 - 5" hold Bridge x 15 - 3" hold 90/90 table top 4x15" Manual Therapy: STM and DTM to the lumbar paraspinals and QL Grade  V mid lumbar roll manipulation  Trigger Point Dry-Needling  Treatment instructions: Expect mild to moderate muscle soreness. S/S of pneumothorax if dry needled over a lung field, and to seek immediate medical attention should they occur. Patient verbalized understanding of these instructions and education.   Patient Consent Given: Yes Education handout provided: No Muscles treated: lumbar multifidus, Iliocostalis lumborum Electrical stimulation performed: No Parameters: N/A Treatment response/outcome: reduction in muscle tone  [facility] Adult PT Treatment:                                                DATE: 04/10/2022 Therapeutic Exercise:  Supine Posterior Pelvic Tilt 10 reps - 5'' hold  Static Prone on Elbows  10 reps Supine Lower Trunk Rotation 10 reps - 5 hold Supine Bridge  10 reps - 5 hold Seated Quadratus Lumborum Stretch in Chair  3 reps - 15 hold Updated HEP Manual Therapy: STM and DTM to the lumbar paraspinals and QL Trigger Point Dry-Needling  Treatment instructions: Expect mild to moderate muscle soreness. S/S of pneumothorax if dry needled over a lung field, and to seek immediate medical attention should they occur. Patient verbalized understanding of these instructions and education.   Patient Consent Given: Yes Education handout provided: No Muscles treated: lumbar multifidus, Iliocostalis lumborum Electrical stimulation performed: No Parameters: N/A Treatment response/outcome: reduction in muscle tone      ASSESSMENT:   CLINICAL IMPRESSION: Kynslei did well with therapy.  Her baseline pain is consistently improving.  She continues to respond well to therex and TDN.  Added in bird dog which was unstable R hip vs L, but completed with no  increase in pain.  Continue per POC.   OBJECTIVE IMPAIRMENTS: Pain, lumbar ROM, gait     GOALS:     SHORT TERM GOALS:   Peniel will be >75% HEP compliant to improve carryover between sessions and facilitate independent management of condition   Evaluation (04/01/2022): ongoing Target date: 04/22/2022 Goal status: 04/16/22 MET     LONG TERM GOALS:   Leanda will improve FOTO score to 54 as a proxy for functional improvement   Evaluation/Baseline (04/01/2022): 37 Target date: 05/27/2022 Goal status: INITIAL     2.  Auria will self report >/= 50% decrease in pain from evaluation    Evaluation/Baseline (04/01/2022): 9/10 max pain Target date: 05/27/2022 Goal status: INITIAL     3.  Dajsha will be able to lift 15 lbs from the floor and place on a 3 foot counter, not limited by pain    Evaluation/Baseline (04/01/2022): unable Target date: 05/27/2022 Goal status: INITIAL      4.  Mikenna will be able to walk for 30-40 min, not limited by pain    Evaluation/Baseline (04/01/2022): limited by pain Target date: 05/27/2022 Goal status: INITIAL       5.  Alayha will be able to complete housework, not limited by pain   Evaluation/Baseline (04/01/2022): limited Target date: 05/27/2022 Goal status: INITIAL       PLAN: PT FREQUENCY: 1-2x/week   PT DURATION: 8 weeks (Ending 05/27/2022)   PLANNED INTERVENTIONS: Therapeutic exercises, Aquatic therapy, Therapeutic activity, Neuro Muscular re-education, Gait training, Patient/Family education, Joint mobilization, Dry Needling, Electrical stimulation, Spinal mobilization and/or manipulation, Moist heat, Taping, Vasopneumatic device, Ionotophoresis 36m/ml Dexamethasone, and Manual therapy   PLAN FOR NEXT SESSION: TDN prn, manual prn, progressing hip and  core strengthening, femoral nerve glide?     Kevan Ny Allard Lightsey PT 04/24/22 1:43 PM

## 2022-04-28 ENCOUNTER — Ambulatory Visit: Payer: No Typology Code available for payment source

## 2022-04-28 DIAGNOSIS — M545 Low back pain, unspecified: Secondary | ICD-10-CM | POA: Diagnosis not present

## 2022-04-28 DIAGNOSIS — M6281 Muscle weakness (generalized): Secondary | ICD-10-CM

## 2022-04-28 DIAGNOSIS — R2689 Other abnormalities of gait and mobility: Secondary | ICD-10-CM

## 2022-04-28 NOTE — Therapy (Signed)
OUTPATIENT PHYSICAL THERAPY TREATMENT NOTE   Patient Name: RAVENNE WAYMENT MRN: [id number] DOB:1960/12/11, 61 y.o., female Today's Date: 04/28/2022  PCP: Marco Collie, MD REFERRING PROVIDER: Eustace Moore, MD  END OF SESSION:   PT End of Session - 04/28/22 1124     Visit Number 7    Number of Visits 15    Date for PT Re-Evaluation 05/27/22    Authorization Type [facility] - FOTO    Authorization Time Period 01/23/2022 -07/22/2022    Progress Note Due on Visit 10    PT Start Time 1130    PT Stop Time 1210    PT Time Calculation (min) 40 min    Activity Tolerance Patient tolerated treatment well    Behavior During Therapy [facility] for tasks assessed/performed               Past Medical History:  Diagnosis Date   Allergy    Anxiety    Depression    Hyperlipidemia    Sleep apnea    cpap   Past Surgical History:  Procedure Laterality Date   COLONOSCOPY     TONSILLECTOMY     april 2016   There are no problems to display for this patient.   REFERRING DIAG: Low back pain, unspecified back pain laterality, unspecified chronicity, unspecified whether sciatica present -   THERAPY DIAG:  Low back pain, unspecified back pain laterality, unspecified chronicity, unspecified whether sciatica present  Other abnormalities of gait and mobility  Muscle weakness  SUBJECTIVE:  Pt presents to PT with reports of decreased LBP. Did have 7/10 pain over the weekend after yard work. Pt is ready to begin PT at this time.   Are you having pain? Yes Pain location: L>R LBP NPRS scale:  current 2/10  Aggravating factors: walking for longer distances (30 min), standing for long periods Relieving factors: Ice Pain description: intermittent, constant, sharp, and aching Stage: Chronic Stability: staying the same 24 hour pattern: loosens in up after the morning and then worse with activity    Occupation: NA   Assistive Device: NA   Hand Dominance: NA   Patient Goals/Specific Activities:  get back to baseline, fast walking (30 - 45 min)     PLOF: Independent   DIAGNOSTIC FINDINGS: Disk bulge L4-L5     OBJECTIVE:    GENERAL OBSERVATION/GAIT:           Unable to toe walk d/t weakness and some pain in posterior legs to knees   SENSATION:          Light touch: Deficits toes of L foot   MUSCLE LENGTH: Hamstrings: Right no restriction; Left no restriction ASLR: Right ASLR significantly reduced compared to PSLR ; Left ASLR significantly reduced compared to PSLR                      Ely's: (+) for tightness and LBP bil   LUMBAR AROM   AROM AROM  04/01/2022  Flexion WNL, w/ no pain (gowers)  Extension limited by 50%, w/ concordant pain  Right lateral flexion WNL, w/ concordant pain  Left lateral flexion WNL  Right rotation limited by 50%  Left rotation limited by 50%    (Blank rows = not tested)   DIRECTIONAL PREFERENCE:           None clear   LE MMT:   MMT Right 04/01/2022 Left 04/01/2022  Hip flexion (L2, L3) N N  Knee extension (L3) N N  Knee  flexion N N  Hip abduction      Hip extension      Hip external rotation      Hip internal rotation      Hip adduction      Ankle dorsiflexion (L4) N N  Ankle plantarflexion (S1) D D  Ankle inversion      Ankle eversion      Great Toe ext (L5)      Grossly        (Blank rows = not tested, score listed is out of 5 possible points.  N = WNL, D = diminished, C = clear for gross weakness with myotome testing, * = concordant pain with testing)     Functional Tests   Eval (04/01/2022)      Sustained supine bridge (dominant leg extended at 120'', if reached): NT d/t pain (norm 170'')                                                                                                      LUMBAR SPECIAL TESTS:  Straight leg raise: L (-), R (-) Slump: L (-), R (-)    PALPATION:            TTP and muscle tone lumbar paraspinals L>R with TTP and increased muscle tone L Iliocostalis and L QL    SPINAL  SEGMENTAL MOBILITY ASSESSMENT:  Hypomobile with no radiation of sxs   PATIENT SURVEYS:  FOTO 37 -> 54     ASTERISK SIGNS     Asterisk Signs Eval (04/01/2022) 5/19           Toe walk L unable            pain As high as 9/10, 6/10 average 4/10 average           Distance walked <30 min with pain            ASLR ASLR<PSLR            Elys test Concordant LBP                 HOME EXERCISE PROGRAM:  Access Code: [email or web address] URL: [email or web address] Date: 04/22/2022 Prepared by: Shearon Balo  Exercises - Supine Posterior Pelvic Tilt  - 2 x daily - 7 x weekly - 2 sets - 10 reps - 5'' hold - Static Prone on Elbows  - 1 x daily - 7 x weekly - 3 sets - 10 reps - Supine Lower Trunk Rotation  - 1 x daily - 7 x weekly - 2 sets - 10 reps - 5 hold - Supine Bridge  - 1 x daily - 7 x weekly - 2 sets - 10 reps - 5 hold - Supine Hip Adduction Isometric with Ball  - 1 x daily - 7 x weekly - 2 sets - 10 reps - 10'' hold - Hooklying Clamshell with Resistance  - 1 x daily - 7 x weekly - 3 sets - 10 reps  TODAY'S TREATMENT  [facility] Adult PT Treatment:  DATE: 04/28/2022 Therapeutic Exercise: Bridge x 20 - 3" hold Bird dog - 5'' hold - 2x10 Isometric abdominal squeeze - 2x10 - 5'' squeeze Manual Therapy: STM and DTM to the lumbar paraspinals and QL Skilled palpation to identify trigger points for TDN  Trigger Point Dry-Needling  Treatment instructions: Expect mild to moderate muscle soreness. S/S of pneumothorax if dry needled over a lung field, and to seek immediate medical attention should they occur. Patient verbalized understanding of these instructions and education.   Patient Consent Given: Yes Education handout provided: No Muscles treated: lumbar multifidus, Iliocostalis lumborum Electrical stimulation performed: YES Parameters: 5 min low frequency - milli amps - low intensity, 5 min - micro amps - high frequency high  intensity Treatment response/outcome: reduction in muscle tone  [facility] Adult PT Treatment:                                                DATE: 04/22/2022 Therapeutic Exercise:  Nustep 5 min for warm up while taking subjective SLR from foam roller - 2x10 ea Bird dog - 5'' hold - 10x Isometric abdominal squeeze - 2x10 - 3'' squeeze Manual Therapy: STM and DTM to the lumbar paraspinals and QL Skilled palpation to identify trigger points for TDN  Trigger Point Dry-Needling  Treatment instructions: Expect mild to moderate muscle soreness. S/S of pneumothorax if dry needled over a lung field, and to seek immediate medical attention should they occur. Patient verbalized understanding of these instructions and education.   Patient Consent Given: Yes Education handout provided: No Muscles treated: lumbar multifidus, Iliocostalis lumborum Electrical stimulation performed: YES Parameters: 5 min low frequency - milli amps - low intensity, 5 min - micro amps - high frequency high intensity Treatment response/outcome: reduction in muscle tone  HOME EXERCISE PROGRAM:             Access Code: [email or web address] URL: [email or web address] Date: 04/28/2022 Prepared by: Octavio Manns  Exercises - Supine Posterior Pelvic Tilt  - 2 x daily - 7 x weekly - 2 sets - 10 reps - 5'' hold - Static Prone on Elbows  - 1 x daily - 7 x weekly - 3 sets - 10 reps - Supine Lower Trunk Rotation  - 1 x daily - 7 x weekly - 2 sets - 10 reps - 5 hold - Supine Bridge  - 1 x daily - 7 x weekly - 2 sets - 10 reps - 5 hold - Supine Hip Adduction Isometric with Ball  - 1 x daily - 7 x weekly - 2 sets - 10 reps - 10'' hold - Hooklying Clamshell with Resistance  - 1 x daily - 7 x weekly - 3 sets - 10 reps - Bird Dog  - 1 x daily - 7 x weekly - 2 sets - 10 reps   ASSESSMENT:   CLINICAL IMPRESSION: Pt responded well to therapy today, did note some increased discomfort post TPDN and ESTIM. Therapy also worked on decreasing  soft tissue pain and core muscle endurance. HEP updated for continued core strengthening at home. Will continue to progress as able per POC.    OBJECTIVE IMPAIRMENTS: Pain, lumbar ROM, gait     GOALS:     SHORT TERM GOALS:   Verenise will be >75% HEP compliant to improve carryover between sessions and facilitate independent management of condition   Evaluation (04/01/2022):  ongoing Target date: 04/22/2022 Goal status: 04/16/22 MET     LONG TERM GOALS:   Derika will improve FOTO score to 31 as a proxy for functional improvement   Evaluation/Baseline (04/01/2022): 37 Target date: 05/27/2022 Goal status: INITIAL     2.  Stasha will self report >/= 50% decrease in pain from evaluation    Evaluation/Baseline (04/01/2022): 9/10 max pain Target date: 05/27/2022 Goal status: INITIAL     3.  Shoshanna will be able to lift 15 lbs from the floor and place on a 3 foot counter, not limited by pain    Evaluation/Baseline (04/01/2022): unable Target date: 05/27/2022 Goal status: INITIAL      4.  Yona will be able to walk for 30-40 min, not limited by pain    Evaluation/Baseline (04/01/2022): limited by pain Target date: 05/27/2022 Goal status: INITIAL       5.  Ajooni will be able to complete housework, not limited by pain   Evaluation/Baseline (04/01/2022): limited Target date: 05/27/2022 Goal status: INITIAL       PLAN: PT FREQUENCY: 1-2x/week   PT DURATION: 8 weeks (Ending 05/27/2022)   PLANNED INTERVENTIONS: Therapeutic exercises, Aquatic therapy, Therapeutic activity, Neuro Muscular re-education, Gait training, Patient/Family education, Joint mobilization, Dry Needling, Electrical stimulation, Spinal mobilization and/or manipulation, Moist heat, Taping, Vasopneumatic device, Ionotophoresis 62m/ml Dexamethasone, and Manual therapy   PLAN FOR NEXT SESSION: TDN prn, manual prn, progressing hip and core strengthening, femoral nerve glide?     DWard ChattersPT 04/28/22 12:26 PM

## 2022-04-30 ENCOUNTER — Encounter: Payer: Self-pay | Admitting: Physical Therapy

## 2022-04-30 ENCOUNTER — Ambulatory Visit: Payer: No Typology Code available for payment source | Admitting: Physical Therapy

## 2022-04-30 DIAGNOSIS — M545 Low back pain, unspecified: Secondary | ICD-10-CM | POA: Diagnosis not present

## 2022-04-30 DIAGNOSIS — R2689 Other abnormalities of gait and mobility: Secondary | ICD-10-CM

## 2022-04-30 DIAGNOSIS — M6281 Muscle weakness (generalized): Secondary | ICD-10-CM

## 2022-04-30 NOTE — Therapy (Signed)
OUTPATIENT PHYSICAL THERAPY TREATMENT NOTE   Patient Name: Kristy Walker MRN: [id number] DOB:[birth year], 61 y.o., female Today's Date: 04/30/2022  PCP: Marco Collie, MD REFERRING PROVIDER: Eustace Moore, MD  END OF SESSION:   PT End of Session - 04/30/22 1526     Visit Number 8    Number of Visits 15    Date for PT Re-Evaluation 05/27/22    Authorization Type [facility] - FOTO    Authorization Time Period 01/23/2022 -07/22/2022    Progress Note Due on Visit 10    PT Start Time 1530    PT Stop Time 1610    PT Time Calculation (min) 40 min    Activity Tolerance Patient tolerated treatment well    Behavior During Therapy [facility] for tasks assessed/performed               Past Medical History:  Diagnosis Date   Allergy    Anxiety    Depression    Hyperlipidemia    Sleep apnea    cpap   Past Surgical History:  Procedure Laterality Date   COLONOSCOPY     TONSILLECTOMY     april 2016   There are no problems to display for this patient.   REFERRING DIAG: Low back pain, unspecified back pain laterality, unspecified chronicity, unspecified whether sciatica present -   THERAPY DIAG:  Low back pain, unspecified back pain laterality, unspecified chronicity, unspecified whether sciatica present  Other abnormalities of gait and mobility  Muscle weakness  SUBJECTIVE:  Pt reports that overall the pain is headed in the right direction.   Are you having pain? Yes Pain location: L>R LBP NPRS scale:  current 2/10  Aggravating factors: walking for longer distances (30 min), standing for long periods Relieving factors: Ice Pain description: intermittent, constant, sharp, and aching Stage: Chronic Stability: staying the same 24 hour pattern: loosens in up after the morning and then worse with activity    Occupation: NA   Assistive Device: NA   Hand Dominance: NA   Patient Goals/Specific Activities: get back to baseline, fast walking (30 - 45 min)     PLOF: Independent    DIAGNOSTIC FINDINGS: Disk bulge L4-L5     OBJECTIVE:    GENERAL OBSERVATION/GAIT:           Unable to toe walk d/t weakness and some pain in posterior legs to knees   SENSATION:          Light touch: Deficits toes of L foot   MUSCLE LENGTH: Hamstrings: Right no restriction; Left no restriction ASLR: Right ASLR significantly reduced compared to PSLR ; Left ASLR significantly reduced compared to PSLR                      Ely's: (+) for tightness and LBP bil   LUMBAR AROM   AROM AROM  04/01/2022  Flexion WNL, w/ no pain (gowers)  Extension limited by 50%, w/ concordant pain  Right lateral flexion WNL, w/ concordant pain  Left lateral flexion WNL  Right rotation limited by 50%  Left rotation limited by 50%    (Blank rows = not tested)   DIRECTIONAL PREFERENCE:           None clear   LE MMT:   MMT Right 04/01/2022 Left 04/01/2022  Hip flexion (L2, L3) N N  Knee extension (L3) N N  Knee flexion N N  Hip abduction      Hip extension  Hip external rotation      Hip internal rotation      Hip adduction      Ankle dorsiflexion (L4) N N  Ankle plantarflexion (S1) D D  Ankle inversion      Ankle eversion      Great Toe ext (L5)      Grossly        (Blank rows = not tested, score listed is out of 5 possible points.  N = WNL, D = diminished, C = clear for gross weakness with myotome testing, * = concordant pain with testing)     Functional Tests   Eval (04/01/2022)      Sustained supine bridge (dominant leg extended at 120'', if reached): NT d/t pain (norm 170'')                                                                                                      LUMBAR SPECIAL TESTS:  Straight leg raise: L (-), R (-) Slump: L (-), R (-)    PALPATION:            TTP and muscle tone lumbar paraspinals L>R with TTP and increased muscle tone L Iliocostalis and L QL    SPINAL SEGMENTAL MOBILITY ASSESSMENT:  Hypomobile with no radiation of sxs    PATIENT SURVEYS:  FOTO 37 -> 54     ASTERISK SIGNS     Asterisk Signs Eval (04/01/2022) 5/19  5/25         Toe walk L unable            pain As high as 9/10, 6/10 average 4/10 average  2-3/10 average         Distance walked <30 min with pain            ASLR ASLR<PSLR    ASLR = PSLR        Elys test Concordant LBP                 HOME EXERCISE PROGRAM:  Access Code: [email or web address] URL: [email or web address] Date: 04/22/2022 Prepared by: Shearon Balo  Exercises - Supine Posterior Pelvic Tilt  - 2 x daily - 7 x weekly - 2 sets - 10 reps - 5'' hold - Static Prone on Elbows  - 1 x daily - 7 x weekly - 3 sets - 10 reps - Supine Lower Trunk Rotation  - 1 x daily - 7 x weekly - 2 sets - 10 reps - 5 hold - Supine Bridge  - 1 x daily - 7 x weekly - 2 sets - 10 reps - 5 hold - Supine Hip Adduction Isometric with Ball  - 1 x daily - 7 x weekly - 2 sets - 10 reps - 10'' hold - Hooklying Clamshell with Resistance  - 1 x daily - 7 x weekly - 3 sets - 10 reps  TODAY'S TREATMENT   [facility] Adult PT Treatment:  DATE: 04/30/2022 Therapeutic Exercise: Bridge 3x10 - 5" hold Alternating ASLR - 2x20 Bird dog - 5'' hold - 2x12 Isometric abdominal squeeze - 2x10 - 5'' squeeze  Manual Therapy: STM and DTM to the lumbar paraspinals and QL Skilled palpation to identify trigger points for TDN  Trigger Point Dry-Needling  Treatment instructions: Expect mild to moderate muscle soreness. S/S of pneumothorax if dry needled over a lung field, and to seek immediate medical attention should they occur. Patient verbalized understanding of these instructions and education.   Patient Consent Given: Yes Education handout provided: No Muscles treated: lumbar multifidus, Iliocostalis lumborum Electrical stimulation performed: YES Parameters: 5 min low frequency - milli amps - low intensity, 5 min - micro amps - high frequency high intensity Treatment  response/outcome: reduction in muscle tone  [facility] Adult PT Treatment:                                                DATE: 04/28/2022 Therapeutic Exercise: Bridge x 20 - 3" hold Bird dog - 5'' hold - 2x10 Isometric abdominal squeeze - 2x10 - 5'' squeeze Manual Therapy: STM and DTM to the lumbar paraspinals and QL Skilled palpation to identify trigger points for TDN  Trigger Point Dry-Needling  Treatment instructions: Expect mild to moderate muscle soreness. S/S of pneumothorax if dry needled over a lung field, and to seek immediate medical attention should they occur. Patient verbalized understanding of these instructions and education.   Patient Consent Given: Yes Education handout provided: No Muscles treated: lumbar multifidus, Iliocostalis lumborum Electrical stimulation performed: YES Parameters: 5 min low frequency - milli amps - low intensity, 5 min - micro amps - high frequency high intensity Treatment response/outcome: reduction in muscle tone  [facility] Adult PT Treatment:                                                DATE: 04/22/2022 Therapeutic Exercise:  Nustep 5 min for warm up while taking subjective SLR from foam roller - 2x10 ea Bird dog - 5'' hold - 10x Isometric abdominal squeeze - 2x10 - 3'' squeeze Manual Therapy: STM and DTM to the lumbar paraspinals and QL Skilled palpation to identify trigger points for TDN  Trigger Point Dry-Needling  Treatment instructions: Expect mild to moderate muscle soreness. S/S of pneumothorax if dry needled over a lung field, and to seek immediate medical attention should they occur. Patient verbalized understanding of these instructions and education.   Patient Consent Given: Yes Education handout provided: No Muscles treated: lumbar multifidus, Iliocostalis lumborum Electrical stimulation performed: YES Parameters: 5 min low frequency - milli amps - low intensity, 5 min - micro amps - high frequency high intensity Treatment  response/outcome: reduction in muscle tone  HOME EXERCISE PROGRAM:             Access Code: [email or web address] URL: [email or web address] Date: 04/28/2022 Prepared by: Octavio Manns  Exercises - Supine Posterior Pelvic Tilt  - 2 x daily - 7 x weekly - 2 sets - 10 reps - 5'' hold - Static Prone on Elbows  - 1 x daily - 7 x weekly - 3 sets - 10 reps - Supine Lower Trunk Rotation  - 1 x daily - 7 x weekly -  2 sets - 10 reps - 5 hold - Supine Bridge  - 1 x daily - 7 x weekly - 2 sets - 10 reps - 5 hold - Supine Hip Adduction Isometric with Ball  - 1 x daily - 7 x weekly - 2 sets - 10 reps - 10'' hold - Hooklying Clamshell with Resistance  - 1 x daily - 7 x weekly - 3 sets - 10 reps - Bird Dog  - 1 x daily - 7 x weekly - 2 sets - 10 reps   ASSESSMENT:   CLINICAL IMPRESSION: Jaimee continues to do well with a combination of TDN + manual + light exercise. Her pain level is consistently declining.  Her ASLR is significantly improved today showing improved core control.   OBJECTIVE IMPAIRMENTS: Pain, lumbar ROM, gait     GOALS:     SHORT TERM GOALS:   Lajuan will be >75% HEP compliant to improve carryover between sessions and facilitate independent management of condition   Evaluation (04/01/2022): ongoing Target date: 04/22/2022 Goal status: 04/16/22 MET     LONG TERM GOALS:   Villa will improve FOTO score to 54 as a proxy for functional improvement   Evaluation/Baseline (04/01/2022): 37 Target date: 05/27/2022 Goal status: INITIAL     2.  Deloma will self report >/= 50% decrease in pain from evaluation    Evaluation/Baseline (04/01/2022): 9/10 max pain Target date: 05/27/2022 Goal status: INITIAL     3.  Michaelyn will be able to lift 15 lbs from the floor and place on a 3 foot counter, not limited by pain    Evaluation/Baseline (04/01/2022): unable Target date: 05/27/2022 Goal status: INITIAL      4.  Kodie will be able to walk for 30-40 min, not limited by pain     Evaluation/Baseline (04/01/2022): limited by pain Target date: 05/27/2022 Goal status: INITIAL       5.  Khamille will be able to complete housework, not limited by pain   Evaluation/Baseline (04/01/2022): limited Target date: 05/27/2022 Goal status: INITIAL       PLAN: PT FREQUENCY: 1-2x/week   PT DURATION: 8 weeks (Ending 05/27/2022)   PLANNED INTERVENTIONS: Therapeutic exercises, Aquatic therapy, Therapeutic activity, Neuro Muscular re-education, Gait training, Patient/Family education, Joint mobilization, Dry Needling, Electrical stimulation, Spinal mobilization and/or manipulation, Moist heat, Taping, Vasopneumatic device, Ionotophoresis 27m/ml Dexamethasone, and Manual therapy   PLAN FOR NEXT SESSION: TDN prn, manual prn, progressing hip and core strengthening, femoral nerve glide?     KKevan NyReinhartsen PT 04/30/22 4:12 PM

## 2022-05-05 ENCOUNTER — Ambulatory Visit: Payer: No Typology Code available for payment source

## 2022-05-05 DIAGNOSIS — R2689 Other abnormalities of gait and mobility: Secondary | ICD-10-CM

## 2022-05-05 DIAGNOSIS — M545 Low back pain, unspecified: Secondary | ICD-10-CM

## 2022-05-05 DIAGNOSIS — M6281 Muscle weakness (generalized): Secondary | ICD-10-CM

## 2022-05-05 NOTE — Therapy (Signed)
OUTPATIENT PHYSICAL THERAPY TREATMENT NOTE   Patient Name: Kristy Walker MRN: [id number] DOB:02/01/1961, 61 y.o., female Today's Date: 05/05/2022  PCP: Marco Collie, MD REFERRING PROVIDER: Eustace Moore, MD  END OF SESSION:   PT End of Session - 05/05/22 1353     Visit Number 9    Number of Visits 15    Date for PT Re-Evaluation 05/27/22    Authorization Type [facility] - FOTO    Authorization Time Period 01/23/2022 -07/22/2022    Progress Note Due on Visit 10    PT Start Time 1400    PT Stop Time 1445    PT Time Calculation (min) 45 min    Activity Tolerance Patient tolerated treatment well    Behavior During Therapy [facility] for tasks assessed/performed                Past Medical History:  Diagnosis Date   Allergy    Anxiety    Depression    Hyperlipidemia    Sleep apnea    cpap   Past Surgical History:  Procedure Laterality Date   COLONOSCOPY     TONSILLECTOMY     april 2016   There are no problems to display for this patient.   REFERRING DIAG: Low back pain, unspecified back pain laterality, unspecified chronicity, unspecified whether sciatica present -   THERAPY DIAG:  Low back pain, unspecified back pain laterality, unspecified chronicity, unspecified whether sciatica present  Other abnormalities of gait and mobility  Muscle weakness  SUBJECTIVE:  Pt presents to PT with no current pain. Has been compliant with HEP.   Are you having pain?  No Pain location: L>R LBP NPRS scale:  current 0/10  Aggravating factors: walking for longer distances (30 min), standing for long periods Relieving factors: Ice Pain description: intermittent, constant, sharp, and aching Stage: Chronic Stability: staying the same 24 hour pattern: loosens in up after the morning and then worse with activity    Occupation: NA   Assistive Device: NA   Hand Dominance: NA   Patient Goals/Specific Activities: get back to baseline, fast walking (30 - 45 min)     PLOF:  Independent   DIAGNOSTIC FINDINGS: Disk bulge L4-L5     OBJECTIVE:    GENERAL OBSERVATION/GAIT:           Unable to toe walk d/t weakness and some pain in posterior legs to knees   SENSATION:          Light touch: Deficits toes of L foot   MUSCLE LENGTH: Hamstrings: Right no restriction; Left no restriction ASLR: Right ASLR significantly reduced compared to PSLR ; Left ASLR significantly reduced compared to PSLR                      Ely's: (+) for tightness and LBP bil   LUMBAR AROM   AROM AROM  04/01/2022  Flexion WNL, w/ no pain (gowers)  Extension limited by 50%, w/ concordant pain  Right lateral flexion WNL, w/ concordant pain  Left lateral flexion WNL  Right rotation limited by 50%  Left rotation limited by 50%    (Blank rows = not tested)   DIRECTIONAL PREFERENCE:           None clear   LE MMT:   MMT Right 04/01/2022 Left 04/01/2022  Hip flexion (L2, L3) N N  Knee extension (L3) N N  Knee flexion N N  Hip abduction      Hip extension  Hip external rotation      Hip internal rotation      Hip adduction      Ankle dorsiflexion (L4) N N  Ankle plantarflexion (S1) D D  Ankle inversion      Ankle eversion      Great Toe ext (L5)      Grossly        (Blank rows = not tested, score listed is out of 5 possible points.  N = WNL, D = diminished, C = clear for gross weakness with myotome testing, * = concordant pain with testing)     Functional Tests   Eval (04/01/2022)      Sustained supine bridge (dominant leg extended at 120'', if reached): NT d/t pain (norm 170'')                                                                                                      LUMBAR SPECIAL TESTS:  Straight leg raise: L (-), R (-) Slump: L (-), R (-)    PALPATION:            TTP and muscle tone lumbar paraspinals L>R with TTP and increased muscle tone L Iliocostalis and L QL    SPINAL SEGMENTAL MOBILITY ASSESSMENT:  Hypomobile with no radiation of  sxs   PATIENT SURVEYS:  FOTO 37 -> 54     ASTERISK SIGNS     Asterisk Signs Eval (04/01/2022) 5/19  5/25         Toe walk L unable            pain As high as 9/10, 6/10 average 4/10 average  2-3/10 average         Distance walked <30 min with pain            ASLR ASLR<PSLR    ASLR = PSLR        Elys test Concordant LBP                 HOME EXERCISE PROGRAM:  Access Code: [email or web address] URL: [email or web address] Date: 04/22/2022 Prepared by: Shearon Balo  Exercises - Supine Posterior Pelvic Tilt  - 2 x daily - 7 x weekly - 2 sets - 10 reps - 5'' hold - Static Prone on Elbows  - 1 x daily - 7 x weekly - 3 sets - 10 reps - Supine Lower Trunk Rotation  - 1 x daily - 7 x weekly - 2 sets - 10 reps - 5 hold - Supine Bridge  - 1 x daily - 7 x weekly - 2 sets - 10 reps - 5 hold - Supine Hip Adduction Isometric with Ball  - 1 x daily - 7 x weekly - 2 sets - 10 reps - 10'' hold - Hooklying Clamshell with Resistance  - 1 x daily - 7 x weekly - 3 sets - 10 reps  TODAY'S TREATMENT  [facility] Adult PT Treatment:  DATE: 05/05/2022 Therapeutic Exercise: Bird dog - 5'' hold - 2x12 Isometric abdominal squeeze - 2x10 - 5'' squeeze Dead bug x 10 with  physioball POE x 60" Prone press up x 10 Manual Therapy: STM and DTM to the lumbar paraspinals and QL Skilled palpation to identify trigger points for TDN Trigger Point Dry-Needling  Treatment instructions: Expect mild to moderate muscle soreness. S/S of pneumothorax if dry needled over a lung field, and to seek immediate medical attention should they occur. Patient verbalized understanding of these instructions and education.   Patient Consent Given: Yes Education handout provided: No Muscles treated: lumbar multifidus, Iliocostalis lumborum Electrical stimulation performed: YES Parameters: 5 min low frequency - milli amps - low intensity, 5 min - micro amps - high frequency high  intensity Treatment response/outcome: reduction in muscle tone  [facility] Adult PT Treatment:                                                DATE: 04/30/2022 Therapeutic Exercise: Bridge 3x10 - 5" hold Alternating ASLR - 2x20 Bird dog - 5'' hold - 2x12 Isometric abdominal squeeze - 2x10 - 5'' squeeze  Manual Therapy: STM and DTM to the lumbar paraspinals and QL Skilled palpation to identify trigger points for TDN  Trigger Point Dry-Needling  Treatment instructions: Expect mild to moderate muscle soreness. S/S of pneumothorax if dry needled over a lung field, and to seek immediate medical attention should they occur. Patient verbalized understanding of these instructions and education.   Patient Consent Given: Yes Education handout provided: No Muscles treated: lumbar multifidus, Iliocostalis lumborum Electrical stimulation performed: YES Parameters: 5 min low frequency - milli amps - low intensity, 5 min - micro amps - high frequency high intensity Treatment response/outcome: reduction in muscle tone  [facility] Adult PT Treatment:                                                DATE: 04/28/2022 Therapeutic Exercise: Bridge x 20 - 3" hold Bird dog - 5'' hold - 2x10 Isometric abdominal squeeze - 2x10 - 5'' squeeze Manual Therapy: STM and DTM to the lumbar paraspinals and QL Skilled palpation to identify trigger points for TDN  Trigger Point Dry-Needling  Treatment instructions: Expect mild to moderate muscle soreness. S/S of pneumothorax if dry needled over a lung field, and to seek immediate medical attention should they occur. Patient verbalized understanding of these instructions and education.   Patient Consent Given: Yes Education handout provided: No Muscles treated: lumbar multifidus, Iliocostalis lumborum Electrical stimulation performed: YES Parameters: 5 min low frequency - milli amps - low intensity, 5 min - micro amps - high frequency high intensity Treatment response/outcome:  reduction in muscle tone   HOME EXERCISE PROGRAM:             Access Code: [email or web address] URL: [email or web address] Date: 04/28/2022 Prepared by: David Stroup  Exercises - Supine Posterior Pelvic Tilt  - 2 x daily - 7 x weekly - 2 sets - 10 reps - 5'' hold - Static Prone on Elbows  - 1 x daily - 7 x weekly - 3 sets - 10 reps - Supine Lower Trunk Rotation  - 1 x daily - 7 x weekly - 2   sets - 10 reps - 5 hold - Supine Bridge  - 1 x daily - 7 x weekly - 2 sets - 10 reps - 5 hold - Supine Hip Adduction Isometric with Ball  - 1 x daily - 7 x weekly - 2 sets - 10 reps - 10'' hold - Hooklying Clamshell with Resistance  - 1 x daily - 7 x weekly - 3 sets - 10 reps - Bird Dog  - 1 x daily - 7 x weekly - 2 sets - 10 reps   ASSESSMENT:   CLINICAL IMPRESSION: Pt was able to complete all prescribed exercises with no adverse effect or increase in pain. Noted decrease in soft tissue discomfort after manual therapy interventions. She continues to progress well with therapy and will continue to be seen and progressed as able.    OBJECTIVE IMPAIRMENTS: Pain, lumbar ROM, gait     GOALS:     SHORT TERM GOALS:   Desaray will be >75% HEP compliant to improve carryover between sessions and facilitate independent management of condition   Evaluation (04/01/2022): ongoing Target date: 04/22/2022 Goal status: 04/16/22 MET     LONG TERM GOALS:   Arvie will improve FOTO score to 54 as a proxy for functional improvement   Evaluation/Baseline (04/01/2022): 37 Target date: 05/27/2022 Goal status: INITIAL     2.  Keslie will self report >/= 50% decrease in pain from evaluation    Evaluation/Baseline (04/01/2022): 9/10 max pain Target date: 05/27/2022 Goal status: INITIAL     3.  Dmiya will be able to lift 15 lbs from the floor and place on a 3 foot counter, not limited by pain    Evaluation/Baseline (04/01/2022): unable Target date: 05/27/2022 Goal status: INITIAL      4.  Kriste will be able  to walk for 30-40 min, not limited by pain    Evaluation/Baseline (04/01/2022): limited by pain Target date: 05/27/2022 Goal status: INITIAL       5.  Caffie will be able to complete housework, not limited by pain   Evaluation/Baseline (04/01/2022): limited Target date: 05/27/2022 Goal status: INITIAL       PLAN: PT FREQUENCY: 1-2x/week   PT DURATION: 8 weeks (Ending 05/27/2022)   PLANNED INTERVENTIONS: Therapeutic exercises, Aquatic therapy, Therapeutic activity, Neuro Muscular re-education, Gait training, Patient/Family education, Joint mobilization, Dry Needling, Electrical stimulation, Spinal mobilization and/or manipulation, Moist heat, Taping, Vasopneumatic device, Ionotophoresis 7m/ml Dexamethasone, and Manual therapy   PLAN FOR NEXT SESSION: TDN prn, manual prn, progressing hip and core strengthening, femoral nerve glide?     DWard ChattersPT 05/05/22 3:44 PM

## 2022-05-07 ENCOUNTER — Ambulatory Visit: Payer: No Typology Code available for payment source | Attending: Neurological Surgery | Admitting: Physical Therapy

## 2022-05-07 ENCOUNTER — Encounter: Payer: Self-pay | Admitting: Physical Therapy

## 2022-05-07 DIAGNOSIS — R2689 Other abnormalities of gait and mobility: Secondary | ICD-10-CM | POA: Diagnosis present

## 2022-05-07 DIAGNOSIS — M6281 Muscle weakness (generalized): Secondary | ICD-10-CM | POA: Diagnosis present

## 2022-05-07 DIAGNOSIS — M545 Low back pain, unspecified: Secondary | ICD-10-CM | POA: Diagnosis present

## 2022-05-07 NOTE — Therapy (Signed)
OUTPATIENT PHYSICAL THERAPY TREATMENT NOTE   Patient Name: Kristy Walker MRN: [id number] DOB:Sep 28, 1961, 61 y.o., female Today's Date: 05/07/2022  PCP: Marco Collie, MD REFERRING PROVIDER: Eustace Moore, MD  END OF SESSION:   PT End of Session - 05/07/22 1046     Visit Number 10    Number of Visits 15    Date for PT Re-Evaluation 05/27/22    Authorization Type [facility] - FOTO    Authorization Time Period 01/23/2022 -07/22/2022    Progress Note Due on Visit 10    PT Start Time 1287    PT Stop Time 1125    PT Time Calculation (min) 40 min    Activity Tolerance Patient tolerated treatment well    Behavior During Therapy [facility] for tasks assessed/performed                Past Medical History:  Diagnosis Date   Allergy    Anxiety    Depression    Hyperlipidemia    Sleep apnea    cpap   Past Surgical History:  Procedure Laterality Date   COLONOSCOPY     TONSILLECTOMY     april 2016   There are no problems to display for this patient.   REFERRING DIAG: Low back pain, unspecified back pain laterality, unspecified chronicity, unspecified whether sciatica present -   THERAPY DIAG:  Low back pain, unspecified back pain laterality, unspecified chronicity, unspecified whether sciatica present  Other abnormalities of gait and mobility  Muscle weakness  SUBJECTIVE:  Pt continues to improve.  Are you having pain?  No Pain location: L>R LBP NPRS scale:  current 1/10  Aggravating factors: walking for longer distances (30 min), standing for long periods Relieving factors: Ice Pain description: intermittent, constant, sharp, and aching Stage: Chronic Stability: staying the same 24 hour pattern: loosens in up after the morning and then worse with activity    Occupation: NA   Assistive Device: NA   Hand Dominance: NA   Patient Goals/Specific Activities: get back to baseline, fast walking (30 - 45 min)     PLOF: Independent   DIAGNOSTIC FINDINGS: Disk bulge  L4-L5     OBJECTIVE:    GENERAL OBSERVATION/GAIT:           Unable to toe walk d/t weakness and some pain in posterior legs to knees   SENSATION:          Light touch: Deficits toes of L foot   MUSCLE LENGTH: Hamstrings: Right no restriction; Left no restriction ASLR: Right ASLR significantly reduced compared to PSLR ; Left ASLR significantly reduced compared to PSLR                      Ely's: (+) for tightness and LBP bil   LUMBAR AROM   AROM AROM  04/01/2022  Flexion WNL, w/ no pain (gowers)  Extension limited by 50%, w/ concordant pain  Right lateral flexion WNL, w/ concordant pain  Left lateral flexion WNL  Right rotation limited by 50%  Left rotation limited by 50%    (Blank rows = not tested)   DIRECTIONAL PREFERENCE:           None clear   LE MMT:   MMT Right 04/01/2022 Left 04/01/2022  Hip flexion (L2, L3) N N  Knee extension (L3) N N  Knee flexion N N  Hip abduction      Hip extension      Hip external rotation  Hip internal rotation      Hip adduction      Ankle dorsiflexion (L4) N N  Ankle plantarflexion (S1) D D  Ankle inversion      Ankle eversion      Great Toe ext (L5)      Grossly        (Blank rows = not tested, score listed is out of 5 possible points.  N = WNL, D = diminished, C = clear for gross weakness with myotome testing, * = concordant pain with testing)     Functional Tests   Eval (04/01/2022)      Sustained supine bridge (dominant leg extended at 120'', if reached): NT d/t pain (norm 170'')                                                                                                      LUMBAR SPECIAL TESTS:  Straight leg raise: L (-), R (-) Slump: L (-), R (-)    PALPATION:            TTP and muscle tone lumbar paraspinals L>R with TTP and increased muscle tone L Iliocostalis and L QL    SPINAL SEGMENTAL MOBILITY ASSESSMENT:  Hypomobile with no radiation of sxs   PATIENT SURVEYS:  FOTO 37 -> 54      ASTERISK SIGNS     Asterisk Signs Eval (04/01/2022) 5/19  5/25         Toe walk L unable            pain As high as 9/10, 6/10 average 4/10 average  2-3/10 average         Distance walked <30 min with pain            ASLR ASLR<PSLR    ASLR = PSLR        Elys test Concordant LBP                 HOME EXERCISE PROGRAM:  Access Code: [email or web address] URL: [email or web address] Date: 04/22/2022 Prepared by: Shearon Balo  Exercises - Supine Posterior Pelvic Tilt  - 2 x daily - 7 x weekly - 2 sets - 10 reps - 5'' hold - Static Prone on Elbows  - 1 x daily - 7 x weekly - 3 sets - 10 reps - Supine Lower Trunk Rotation  - 1 x daily - 7 x weekly - 2 sets - 10 reps - 5 hold - Supine Bridge  - 1 x daily - 7 x weekly - 2 sets - 10 reps - 5 hold - Supine Hip Adduction Isometric with Ball  - 1 x daily - 7 x weekly - 2 sets - 10 reps - 10'' hold - Hooklying Clamshell with Resistance  - 1 x daily - 7 x weekly - 3 sets - 10 reps  TODAY'S TREATMENT   [facility] Adult PT Treatment:  DATE: 05/07/2022 Therapeutic Exercise: Marina Gravel dog - 5'' hold - 2x10 Dead bug 2x10 with  physioball POE x 60" Prone press up x 10  Manual Therapy: STM and DTM to the lumbar paraspinals and QL Skilled palpation to identify trigger points for TDN  Trigger Point Dry-Needling  Treatment instructions: Expect mild to moderate muscle soreness. S/S of pneumothorax if dry needled over a lung field, and to seek immediate medical attention should they occur. Patient verbalized understanding of these instructions and education.   Patient Consent Given: Yes Education handout provided: No Muscles treated: lumbar multifidus, Iliocostalis lumborum Electrical stimulation performed: YES Parameters: 5 min low frequency - milli amps - low intensity, 5 min - micro amps - high frequency high intensity Treatment response/outcome: reduction in muscle tone  [facility] Adult PT Treatment:                                                 DATE: 05/05/2022 Therapeutic Exercise: Bird dog - 5'' hold - 2x12 Isometric abdominal squeeze - 2x10 - 5'' squeeze Dead bug x 10 with  physioball POE x 60" Prone press up x 10 Manual Therapy: STM and DTM to the lumbar paraspinals and QL Skilled palpation to identify trigger points for TDN Trigger Point Dry-Needling  Treatment instructions: Expect mild to moderate muscle soreness. S/S of pneumothorax if dry needled over a lung field, and to seek immediate medical attention should they occur. Patient verbalized understanding of these instructions and education.   Patient Consent Given: Yes Education handout provided: No Muscles treated: lumbar multifidus, Iliocostalis lumborum Electrical stimulation performed: YES Parameters: 5 min low frequency - milli amps - low intensity, 5 min - micro amps - high frequency high intensity Treatment response/outcome: reduction in muscle tone  [facility] Adult PT Treatment:                                                DATE: 04/30/2022 Therapeutic Exercise: Bridge 3x10 - 5" hold Alternating ASLR - 2x20 Bird dog - 5'' hold - 2x12 Isometric abdominal squeeze - 2x10 - 5'' squeeze  Manual Therapy: STM and DTM to the lumbar paraspinals and QL Skilled palpation to identify trigger points for TDN  Trigger Point Dry-Needling  Treatment instructions: Expect mild to moderate muscle soreness. S/S of pneumothorax if dry needled over a lung field, and to seek immediate medical attention should they occur. Patient verbalized understanding of these instructions and education.   Patient Consent Given: Yes Education handout provided: No Muscles treated: lumbar multifidus, Iliocostalis lumborum Electrical stimulation performed: YES Parameters: 5 min low frequency - milli amps - low intensity, 5 min - micro amps - high frequency high intensity Treatment response/outcome: reduction in muscle tone  [facility] Adult PT Treatment:                                                 DATE: 04/28/2022 Therapeutic Exercise: Bridge x 20 - 3" hold Bird dog - 5'' hold - 2x10 Isometric abdominal squeeze - 2x10 - 5'' squeeze Manual Therapy: STM and DTM to the lumbar paraspinals and QL  Skilled palpation to identify trigger points for TDN  Trigger Point Dry-Needling  Treatment instructions: Expect mild to moderate muscle soreness. S/S of pneumothorax if dry needled over a lung field, and to seek immediate medical attention should they occur. Patient verbalized understanding of these instructions and education.   Patient Consent Given: Yes Education handout provided: No Muscles treated: lumbar multifidus, Iliocostalis lumborum Electrical stimulation performed: YES Parameters: 5 min low frequency - milli amps - low intensity, 5 min - micro amps - high frequency high intensity Treatment response/outcome: reduction in muscle tone   HOME EXERCISE PROGRAM:             Access Code: [email or web address] URL: [email or web address] Date: 04/28/2022 Prepared by: Octavio Manns  Exercises - Supine Posterior Pelvic Tilt  - 2 x daily - 7 x weekly - 2 sets - 10 reps - 5'' hold - Static Prone on Elbows  - 1 x daily - 7 x weekly - 3 sets - 10 reps - Supine Lower Trunk Rotation  - 1 x daily - 7 x weekly - 2 sets - 10 reps - 5 hold - Supine Bridge  - 1 x daily - 7 x weekly - 2 sets - 10 reps - 5 hold - Supine Hip Adduction Isometric with Ball  - 1 x daily - 7 x weekly - 2 sets - 10 reps - 10'' hold - Hooklying Clamshell with Resistance  - 1 x daily - 7 x weekly - 3 sets - 10 reps - Bird Dog  - 1 x daily - 7 x weekly - 2 sets - 10 reps   ASSESSMENT:   CLINICAL IMPRESSION: Mirca continues to show improvement with TDN and exercise combination.  She reports resolution of pain following therapy today.  She continues to report improved function at home and plans to start a walking program today.  OBJECTIVE IMPAIRMENTS: Pain, lumbar ROM, gait      GOALS:     SHORT TERM GOALS:   Cherene will be >75% HEP compliant to improve carryover between sessions and facilitate independent management of condition   Evaluation (04/01/2022): ongoing Target date: 04/22/2022 Goal status: 04/16/22 MET     LONG TERM GOALS:   Staysha will improve FOTO score to 59 as a proxy for functional improvement   Evaluation/Baseline (04/01/2022): 37 Target date: 05/27/2022 6/1: 65 Goal status: MET     2.  Ashyia will self report >/= 50% decrease in pain from evaluation    Evaluation/Baseline (04/01/2022): 9/10 max pain Target date: 05/27/2022 6/1: MET 2/10 average Goal status: MET     3.  Cristy will be able to lift 15 lbs from the floor and place on a 3 foot counter, not limited by pain    Evaluation/Baseline (04/01/2022): unable Target date: 05/27/2022 Goal status: INITIAL      4.  Eliani will be able to walk for 30-40 min, not limited by pain    Evaluation/Baseline (04/01/2022): limited by pain Target date: 05/27/2022 6/1: plans to start today Goal status: ongoing       5.  Keliah will be able to complete housework, not limited by pain   Evaluation/Baseline (04/01/2022): limited Target date: 05/27/2022 6/1: light housework Goal status: progressing       PLAN: PT FREQUENCY: 1-2x/week   PT DURATION: 8 weeks (Ending 05/27/2022)   PLANNED INTERVENTIONS: Therapeutic exercises, Aquatic therapy, Therapeutic activity, Neuro Muscular re-education, Gait training, Patient/Family education, Joint mobilization, Dry Needling, Electrical stimulation, Spinal mobilization and/or manipulation, Moist heat, Taping, Vasopneumatic  device, Ionotophoresis 9m/ml Dexamethasone, and Manual therapy   PLAN FOR NEXT SESSION: TDN prn, manual prn, progressing hip and core strengthening, femoral nerve glide?     KKevan NyReinhartsen PT 05/07/22 11:27 AM

## 2022-05-12 ENCOUNTER — Ambulatory Visit: Payer: No Typology Code available for payment source

## 2022-05-14 ENCOUNTER — Encounter: Payer: Self-pay | Admitting: Physical Therapy

## 2022-05-14 ENCOUNTER — Ambulatory Visit: Payer: No Typology Code available for payment source | Admitting: Physical Therapy

## 2022-05-14 DIAGNOSIS — M545 Low back pain, unspecified: Secondary | ICD-10-CM | POA: Diagnosis not present

## 2022-05-14 DIAGNOSIS — R2689 Other abnormalities of gait and mobility: Secondary | ICD-10-CM

## 2022-05-14 DIAGNOSIS — M6281 Muscle weakness (generalized): Secondary | ICD-10-CM

## 2022-05-14 NOTE — Therapy (Signed)
OUTPATIENT PHYSICAL THERAPY TREATMENT NOTE   Patient Name: Kristy Walker MRN: [id number] DOB:[birth year], 61 y.o., female Today's Date: 05/14/2022  PCP: Marco Collie, MD REFERRING PROVIDER: Eustace Moore, MD  END OF SESSION:   PT End of Session - 05/14/22 1130     Visit Number 11    Number of Visits 15    Date for PT Re-Evaluation 05/27/22    Authorization Type [facility] - FOTO    Authorization Time Period 01/23/2022 -07/22/2022    Progress Note Due on Visit 10    PT Start Time 1130    PT Stop Time 1210    PT Time Calculation (min) 40 min    Activity Tolerance Patient tolerated treatment well    Behavior During Therapy [facility] for tasks assessed/performed                Past Medical History:  Diagnosis Date   Allergy    Anxiety    Depression    Hyperlipidemia    Sleep apnea    cpap   Past Surgical History:  Procedure Laterality Date   COLONOSCOPY     TONSILLECTOMY     april 2016   There are no problems to display for this patient.   REFERRING DIAG: Low back pain, unspecified back pain laterality, unspecified chronicity, unspecified whether sciatica present -   THERAPY DIAG:  Low back pain, unspecified back pain laterality, unspecified chronicity, unspecified whether sciatica present  Other abnormalities of gait and mobility  Muscle weakness  SUBJECTIVE:  Pt reports that she is doing well.  She plans on going walking after the session.  Are you having pain?  No Pain location: L>R LBP NPRS scale:  current 1/10  Aggravating factors: walking for longer distances (30 min), standing for long periods Relieving factors: Ice Pain description: intermittent, constant, sharp, and aching Stage: Chronic Stability: staying the same 24 hour pattern: loosens in up after the morning and then worse with activity    Occupation: NA   Assistive Device: NA   Hand Dominance: NA   Patient Goals/Specific Activities: get back to baseline, fast walking (30 - 45 min)      PLOF: Independent   DIAGNOSTIC FINDINGS: Disk bulge L4-L5     OBJECTIVE:    GENERAL OBSERVATION/GAIT:           Unable to toe walk d/t weakness and some pain in posterior legs to knees   SENSATION:          Light touch: Deficits toes of L foot   MUSCLE LENGTH: Hamstrings: Right no restriction; Left no restriction ASLR: Right ASLR significantly reduced compared to PSLR ; Left ASLR significantly reduced compared to PSLR                      Ely's: (+) for tightness and LBP bil   LUMBAR AROM   AROM AROM  04/01/2022  Flexion WNL, w/ no pain (gowers)  Extension limited by 50%, w/ concordant pain  Right lateral flexion WNL, w/ concordant pain  Left lateral flexion WNL  Right rotation limited by 50%  Left rotation limited by 50%    (Blank rows = not tested)   DIRECTIONAL PREFERENCE:           None clear   LE MMT:   MMT Right 04/01/2022 Left 04/01/2022  Hip flexion (L2, L3) N N  Knee extension (L3) N N  Knee flexion N N  Hip abduction  Hip extension      Hip external rotation      Hip internal rotation      Hip adduction      Ankle dorsiflexion (L4) N N  Ankle plantarflexion (S1) D D  Ankle inversion      Ankle eversion      Great Toe ext (L5)      Grossly        (Blank rows = not tested, score listed is out of 5 possible points.  N = WNL, D = diminished, C = clear for gross weakness with myotome testing, * = concordant pain with testing)     Functional Tests   Eval (04/01/2022)      Sustained supine bridge (dominant leg extended at 120'', if reached): NT d/t pain (norm 170'')                                                                                                      LUMBAR SPECIAL TESTS:  Straight leg raise: L (-), R (-) Slump: L (-), R (-)    PALPATION:            TTP and muscle tone lumbar paraspinals L>R with TTP and increased muscle tone L Iliocostalis and L QL    SPINAL SEGMENTAL MOBILITY ASSESSMENT:  Hypomobile with no  radiation of sxs   PATIENT SURVEYS:  FOTO 37 -> 54     ASTERISK SIGNS     Asterisk Signs Eval (04/01/2022) 5/19  5/25  6/8       Toe walk L unable            pain As high as 9/10, 6/10 average 4/10 average  2-3/10 average  1-2/10 average       Distance walked <30 min with pain            ASLR ASLR<PSLR    ASLR = PSLR        Elys test Concordant LBP                 HOME EXERCISE PROGRAM:  Access Code: [email or web address] URL: [email or web address] Date: 04/22/2022 Prepared by: Shearon Balo  Exercises - Supine Posterior Pelvic Tilt  - 2 x daily - 7 x weekly - 2 sets - 10 reps - 5'' hold - Static Prone on Elbows  - 1 x daily - 7 x weekly - 3 sets - 10 reps - Supine Lower Trunk Rotation  - 1 x daily - 7 x weekly - 2 sets - 10 reps - 5 hold - Supine Bridge  - 1 x daily - 7 x weekly - 2 sets - 10 reps - 5 hold - Supine Hip Adduction Isometric with Ball  - 1 x daily - 7 x weekly - 2 sets - 10 reps - 10'' hold - Hooklying Clamshell with Resistance  - 1 x daily - 7 x weekly - 3 sets - 10 reps  TODAY'S TREATMENT   [facility] Adult PT Treatment:  DATE: 05/14/2022 Therapeutic Exercise: Marina Gravel dog - 5'' hold - 2x10 S/L clam RTB - 2x10 ea Dead bug 3x10 with  physioball Sidelying clam - Blue TB POE x 60" Prone press up x 10  Manual Therapy: STM and DTM to the lumbar paraspinals and QL Skilled palpation to identify trigger points for TDN  Trigger Point Dry-Needling  Treatment instructions: Expect mild to moderate muscle soreness. S/S of pneumothorax if dry needled over a lung field, and to seek immediate medical attention should they occur. Patient verbalized understanding of these instructions and education.   Patient Consent Given: Yes Education handout provided: No Muscles treated: lumbar multifidus, Iliocostalis lumborum Electrical stimulation performed: YES Parameters: 5 min low frequency - milli amps - low intensity, 5 min - micro  amps - high frequency high intensity Treatment response/outcome: reduction in muscle tone  [facility] Adult PT Treatment:                                                DATE: 05/07/2022 Therapeutic Exercise: Bird dog - 5'' hold - 2x10 Dead bug 2x10 with  physioball POE x 60" Prone press up x 10  Manual Therapy: STM and DTM to the lumbar paraspinals and QL Skilled palpation to identify trigger points for TDN  Trigger Point Dry-Needling  Treatment instructions: Expect mild to moderate muscle soreness. S/S of pneumothorax if dry needled over a lung field, and to seek immediate medical attention should they occur. Patient verbalized understanding of these instructions and education.   Patient Consent Given: Yes Education handout provided: No Muscles treated: lumbar multifidus, Iliocostalis lumborum Electrical stimulation performed: YES Parameters: 5 min low frequency - milli amps - low intensity, 5 min - micro amps - high frequency high intensity Treatment response/outcome: reduction in muscle tone  [facility] Adult PT Treatment:                                                DATE: 05/05/2022 Therapeutic Exercise: Bird dog - 5'' hold - 2x12 Isometric abdominal squeeze - 2x10 - 5'' squeeze Dead bug x 10 with  physioball POE x 60" Prone press up x 10 Manual Therapy: STM and DTM to the lumbar paraspinals and QL Skilled palpation to identify trigger points for TDN Trigger Point Dry-Needling  Treatment instructions: Expect mild to moderate muscle soreness. S/S of pneumothorax if dry needled over a lung field, and to seek immediate medical attention should they occur. Patient verbalized understanding of these instructions and education.   Patient Consent Given: Yes Education handout provided: No Muscles treated: lumbar multifidus, Iliocostalis lumborum Electrical stimulation performed: YES Parameters: 5 min low frequency - milli amps - low intensity, 5 min - micro amps - high frequency high  intensity Treatment response/outcome: reduction in muscle tone  [facility] Adult PT Treatment:                                                DATE: 04/30/2022 Therapeutic Exercise: Bridge 3x10 - 5" hold Alternating ASLR - 2x20 Bird dog - 5'' hold - 2x12 Isometric abdominal squeeze - 2x10 - 5'' squeeze  Manual Therapy: STM and DTM to the lumbar paraspinals and QL Skilled palpation to identify trigger points for TDN  Trigger Point Dry-Needling  Treatment instructions: Expect mild to moderate muscle soreness. S/S of pneumothorax if dry needled over a lung field, and to seek immediate medical attention should they occur. Patient verbalized understanding of these instructions and education.   Patient Consent Given: Yes Education handout provided: No Muscles treated: lumbar multifidus, Iliocostalis lumborum Electrical stimulation performed: YES Parameters: 5 min low frequency - milli amps - low intensity, 5 min - micro amps - high frequency high intensity Treatment response/outcome: reduction in muscle tone  [facility] Adult PT Treatment:                                                DATE: 04/28/2022 Therapeutic Exercise: Bridge x 20 - 3" hold Bird dog - 5'' hold - 2x10 Isometric abdominal squeeze - 2x10 - 5'' squeeze Manual Therapy: STM and DTM to the lumbar paraspinals and QL Skilled palpation to identify trigger points for TDN  Trigger Point Dry-Needling  Treatment instructions: Expect mild to moderate muscle soreness. S/S of pneumothorax if dry needled over a lung field, and to seek immediate medical attention should they occur. Patient verbalized understanding of these instructions and education.   Patient Consent Given: Yes Education handout provided: No Muscles treated: lumbar multifidus, Iliocostalis lumborum Electrical stimulation performed: YES Parameters: 5 min low frequency - milli amps - low intensity, 5 min - micro amps - high frequency high intensity Treatment response/outcome:  reduction in muscle tone   HOME EXERCISE PROGRAM:             Access Code: [email or web address] URL: [email or web address] Date: 04/28/2022 Prepared by: Octavio Manns  Exercises - Supine Posterior Pelvic Tilt  - 2 x daily - 7 x weekly - 2 sets - 10 reps - 5'' hold - Static Prone on Elbows  - 1 x daily - 7 x weekly - 3 sets - 10 reps - Supine Lower Trunk Rotation  - 1 x daily - 7 x weekly - 2 sets - 10 reps - 5 hold - Supine Bridge  - 1 x daily - 7 x weekly - 2 sets - 10 reps - 5 hold - Supine Hip Adduction Isometric with Ball  - 1 x daily - 7 x weekly - 2 sets - 10 reps - 10'' hold - Hooklying Clamshell with Resistance  - 1 x daily - 7 x weekly - 3 sets - 10 reps - Bird Dog  - 1 x daily - 7 x weekly - 2 sets - 10 reps   ASSESSMENT:   CLINICAL IMPRESSION: Pt is doing very well with therapy.  She has significantly reduced muscle tone in her lumbar paraspinals and L QL.  This is accompanied by reduced pain and improved function.  She has resumed a walking program.  OBJECTIVE IMPAIRMENTS: Pain, lumbar ROM, gait     GOALS:     SHORT TERM GOALS:   Sway will be >75% HEP compliant to improve carryover between sessions and facilitate independent management of condition   Evaluation (04/01/2022): ongoing Target date: 04/22/2022 Goal status: 04/16/22 MET     LONG TERM GOALS:   Lynnelle will improve FOTO score to 98 as a proxy for functional improvement   Evaluation/Baseline (04/01/2022): 37 Target date: 05/27/2022 6/1: 65 Goal status: MET  2.  Eloni will self report >/= 50% decrease in pain from evaluation    Evaluation/Baseline (04/01/2022): 9/10 max pain Target date: 05/27/2022 6/1: MET 2/10 average Goal status: MET     3.  Takiera will be able to lift 15 lbs from the floor and place on a 3 foot counter, not limited by pain    Evaluation/Baseline (04/01/2022): unable Target date: 05/27/2022 Goal status: INITIAL      4.  Tyreshia will be able to walk for 30-40 min, not  limited by pain    Evaluation/Baseline (04/01/2022): limited by pain Target date: 05/27/2022 6/1: plans to start today Goal status: ongoing       5.  Carisma will be able to complete housework, not limited by pain   Evaluation/Baseline (04/01/2022): limited Target date: 05/27/2022 6/1: light housework Goal status: progressing       PLAN: PT FREQUENCY: 1-2x/week   PT DURATION: 8 weeks (Ending 05/27/2022)   PLANNED INTERVENTIONS: Therapeutic exercises, Aquatic therapy, Therapeutic activity, Neuro Muscular re-education, Gait training, Patient/Family education, Joint mobilization, Dry Needling, Electrical stimulation, Spinal mobilization and/or manipulation, Moist heat, Taping, Vasopneumatic device, Ionotophoresis 57m/ml Dexamethasone, and Manual therapy   PLAN FOR NEXT SESSION: TDN prn, manual prn, progressing hip and core strengthening, femoral nerve glide?     KKevan NyReinhartsen PT 05/14/22 12:13 PM

## 2022-05-19 ENCOUNTER — Encounter: Payer: Self-pay | Admitting: Physical Therapy

## 2022-05-19 ENCOUNTER — Ambulatory Visit: Payer: No Typology Code available for payment source | Admitting: Physical Therapy

## 2022-05-19 DIAGNOSIS — R2689 Other abnormalities of gait and mobility: Secondary | ICD-10-CM

## 2022-05-19 DIAGNOSIS — M6281 Muscle weakness (generalized): Secondary | ICD-10-CM

## 2022-05-19 DIAGNOSIS — M545 Low back pain, unspecified: Secondary | ICD-10-CM | POA: Diagnosis not present

## 2022-05-19 NOTE — Therapy (Signed)
OUTPATIENT PHYSICAL THERAPY TREATMENT NOTE   Patient Name: Kristy Walker MRN: [id number] DOB:09/26/1961, 61 y.o., female Today's Date: 05/19/2022  PCP: Marco Collie, MD REFERRING PROVIDER: Eustace Moore, MD  END OF SESSION:   PT End of Session - 05/19/22 1527     Visit Number 12    Number of Visits 15    Date for PT Re-Evaluation 05/27/22    Authorization Type [facility] - FOTO    Authorization Time Period 01/23/2022 -07/22/2022    Progress Note Due on Visit 10    PT Start Time 1530    PT Stop Time 1612    PT Time Calculation (min) 42 min    Activity Tolerance Patient tolerated treatment well    Behavior During Therapy [facility] for tasks assessed/performed                Past Medical History:  Diagnosis Date   Allergy    Anxiety    Depression    Hyperlipidemia    Sleep apnea    cpap   Past Surgical History:  Procedure Laterality Date   COLONOSCOPY     TONSILLECTOMY     april 2016   There are no problems to display for this patient.   REFERRING DIAG: Low back pain, unspecified back pain laterality, unspecified chronicity, unspecified whether sciatica present -   THERAPY DIAG:  Low back pain, unspecified back pain laterality, unspecified chronicity, unspecified whether sciatica present  Other abnormalities of gait and mobility  Muscle weakness  SUBJECTIVE:  Pt reports that she is doing well.  She plans on going walking after the session.  Are you having pain?  No Pain location: L>R LBP NPRS scale:  current 1/10  Aggravating factors: walking for longer distances (30 min), standing for long periods Relieving factors: Ice Pain description: intermittent, constant, sharp, and aching Stage: Chronic Stability: staying the same 24 hour pattern: loosens in up after the morning and then worse with activity    Occupation: NA   Assistive Device: NA   Hand Dominance: NA   Patient Goals/Specific Activities: get back to baseline, fast walking (30 - 45 min)      PLOF: Independent   DIAGNOSTIC FINDINGS: Disk bulge L4-L5     OBJECTIVE:    GENERAL OBSERVATION/GAIT:           Unable to toe walk d/t weakness and some pain in posterior legs to knees   SENSATION:          Light touch: Deficits toes of L foot   MUSCLE LENGTH: Hamstrings: Right no restriction; Left no restriction ASLR: Right ASLR significantly reduced compared to PSLR ; Left ASLR significantly reduced compared to PSLR                      Ely's: (+) for tightness and LBP bil   LUMBAR AROM   AROM AROM  04/01/2022  Flexion WNL, w/ no pain (gowers)  Extension limited by 50%, w/ concordant pain  Right lateral flexion WNL, w/ concordant pain  Left lateral flexion WNL  Right rotation limited by 50%  Left rotation limited by 50%    (Blank rows = not tested)   DIRECTIONAL PREFERENCE:           None clear   LE MMT:   MMT Right 04/01/2022 Left 04/01/2022  Hip flexion (L2, L3) N N  Knee extension (L3) N N  Knee flexion N N  Hip abduction  Hip extension      Hip external rotation      Hip internal rotation      Hip adduction      Ankle dorsiflexion (L4) N N  Ankle plantarflexion (S1) D D  Ankle inversion      Ankle eversion      Great Toe ext (L5)      Grossly        (Blank rows = not tested, score listed is out of 5 possible points.  N = WNL, D = diminished, C = clear for gross weakness with myotome testing, * = concordant pain with testing)     Functional Tests   Eval (04/01/2022)      Sustained supine bridge (dominant leg extended at 120'', if reached): NT d/t pain (norm 170'')                                                                                                      LUMBAR SPECIAL TESTS:  Straight leg raise: L (-), R (-) Slump: L (-), R (-)    PALPATION:            TTP and muscle tone lumbar paraspinals L>R with TTP and increased muscle tone L Iliocostalis and L QL    SPINAL SEGMENTAL MOBILITY ASSESSMENT:  Hypomobile with no  radiation of sxs   PATIENT SURVEYS:  FOTO 37 -> 54     ASTERISK SIGNS     Asterisk Signs Eval (04/01/2022) 5/19  5/25  6/8       Toe walk L unable            pain As high as 9/10, 6/10 average 4/10 average  2-3/10 average  1-2/10 average       Distance walked <30 min with pain            ASLR ASLR<PSLR    ASLR = PSLR        Elys test Concordant LBP                 HOME EXERCISE PROGRAM:  Access Code: [email or web address] URL: [email or web address] Date: 04/22/2022 Prepared by: Shearon Balo  Exercises - Supine Posterior Pelvic Tilt  - 2 x daily - 7 x weekly - 2 sets - 10 reps - 5'' hold - Static Prone on Elbows  - 1 x daily - 7 x weekly - 3 sets - 10 reps - Supine Lower Trunk Rotation  - 1 x daily - 7 x weekly - 2 sets - 10 reps - 5 hold - Supine Bridge  - 1 x daily - 7 x weekly - 2 sets - 10 reps - 5 hold - Supine Hip Adduction Isometric with Ball  - 1 x daily - 7 x weekly - 2 sets - 10 reps - 10'' hold - Hooklying Clamshell with Resistance  - 1 x daily - 7 x weekly - 3 sets - 10 reps  TODAY'S TREATMENT   [facility] Adult PT Treatment:  DATE: 05/19/2022 Therapeutic Exercise: [place] dog - 5'' hold - 3x10 S/L clam RTB - 3x10 ea Dead bug 3x10 with physioball Sit to stand 10# - 2x10  Manual Therapy: STM and DTM to the lumbar paraspinals and QL Skilled palpation to identify trigger points for TDN  Trigger Point Dry-Needling  Treatment instructions: Expect mild to moderate muscle soreness. S/S of pneumothorax if dry needled over a lung field, and to seek immediate medical attention should they occur. Patient verbalized understanding of these instructions and education.   Patient Consent Given: Yes Education handout provided: No Muscles treated: lumbar multifidus, Iliocostalis lumborum Electrical stimulation performed: YES Parameters: 6 min low frequency - milli amps - low intensity, 6 min - micro amps - high frequency high  intensity Treatment response/outcome: reduction in muscle tone  [facility] Adult PT Treatment:                                                DATE: 05/14/2022 Therapeutic Exercise: Bird dog - 5'' hold - 2x10 S/L clam RTB - 2x10 ea Dead bug 3x10 with  physioball Sidelying clam - Blue TB POE x 60" Prone press up x 10  Manual Therapy: STM and DTM to the lumbar paraspinals and QL Skilled palpation to identify trigger points for TDN  Trigger Point Dry-Needling  Treatment instructions: Expect mild to moderate muscle soreness. S/S of pneumothorax if dry needled over a lung field, and to seek immediate medical attention should they occur. Patient verbalized understanding of these instructions and education.   Patient Consent Given: Yes Education handout provided: No Muscles treated: lumbar multifidus, Iliocostalis lumborum Electrical stimulation performed: YES Parameters: 5 min low frequency - milli amps - low intensity, 5 min - micro amps - high frequency high intensity Treatment response/outcome: reduction in muscle tone  [facility] Adult PT Treatment:                                                DATE: 05/07/2022 Therapeutic Exercise: Bird dog - 5'' hold - 2x10 Dead bug 2x10 with  physioball POE x 60" Prone press up x 10  Manual Therapy: STM and DTM to the lumbar paraspinals and QL Skilled palpation to identify trigger points for TDN  Trigger Point Dry-Needling  Treatment instructions: Expect mild to moderate muscle soreness. S/S of pneumothorax if dry needled over a lung field, and to seek immediate medical attention should they occur. Patient verbalized understanding of these instructions and education.   Patient Consent Given: Yes Education handout provided: No Muscles treated: lumbar multifidus, Iliocostalis lumborum Electrical stimulation performed: YES Parameters: 5 min low frequency - milli amps - low intensity, 5 min - micro amps - high frequency high intensity Treatment  response/outcome: reduction in muscle tone  [facility] Adult PT Treatment:                                                DATE: 05/05/2022 Therapeutic Exercise: Bird dog - 5'' hold - 2x12 Isometric abdominal squeeze - 2x10 - 5'' squeeze Dead bug x 10 with  physioball POE x 60" Prone press up  x 10 Manual Therapy: STM and DTM to the lumbar paraspinals and QL Skilled palpation to identify trigger points for TDN Trigger Point Dry-Needling  Treatment instructions: Expect mild to moderate muscle soreness. S/S of pneumothorax if dry needled over a lung field, and to seek immediate medical attention should they occur. Patient verbalized understanding of these instructions and education.   Patient Consent Given: Yes Education handout provided: No Muscles treated: lumbar multifidus, Iliocostalis lumborum Electrical stimulation performed: YES Parameters: 5 min low frequency - milli amps - low intensity, 5 min - micro amps - high frequency high intensity Treatment response/outcome: reduction in muscle tone  [facility] Adult PT Treatment:                                                DATE: 04/30/2022 Therapeutic Exercise: Bridge 3x10 - 5" hold Alternating ASLR - 2x20 Bird dog - 5'' hold - 2x12 Isometric abdominal squeeze - 2x10 - 5'' squeeze  Manual Therapy: STM and DTM to the lumbar paraspinals and QL Skilled palpation to identify trigger points for TDN  Trigger Point Dry-Needling  Treatment instructions: Expect mild to moderate muscle soreness. S/S of pneumothorax if dry needled over a lung field, and to seek immediate medical attention should they occur. Patient verbalized understanding of these instructions and education.   Patient Consent Given: Yes Education handout provided: No Muscles treated: lumbar multifidus, Iliocostalis lumborum Electrical stimulation performed: YES Parameters: 5 min low frequency - milli amps - low intensity, 5 min - micro amps - high frequency high intensity Treatment  response/outcome: reduction in muscle tone  [facility] Adult PT Treatment:                                                DATE: 04/28/2022 Therapeutic Exercise: Bridge x 20 - 3" hold Bird dog - 5'' hold - 2x10 Isometric abdominal squeeze - 2x10 - 5'' squeeze Manual Therapy: STM and DTM to the lumbar paraspinals and QL Skilled palpation to identify trigger points for TDN  Trigger Point Dry-Needling  Treatment instructions: Expect mild to moderate muscle soreness. S/S of pneumothorax if dry needled over a lung field, and to seek immediate medical attention should they occur. Patient verbalized understanding of these instructions and education.   Patient Consent Given: Yes Education handout provided: No Muscles treated: lumbar multifidus, Iliocostalis lumborum Electrical stimulation performed: YES Parameters: 5 min low frequency - milli amps - low intensity, 5 min - micro amps - high frequency high intensity Treatment response/outcome: reduction in muscle tone   HOME EXERCISE PROGRAM:             Access Code: [email or web address] URL: [email or web address] Date: 04/28/2022 Prepared by: Octavio Manns  Exercises - Supine Posterior Pelvic Tilt  - 2 x daily - 7 x weekly - 2 sets - 10 reps - 5'' hold - Static Prone on Elbows  - 1 x daily - 7 x weekly - 3 sets - 10 reps - Supine Lower Trunk Rotation  - 1 x daily - 7 x weekly - 2 sets - 10 reps - 5 hold - Supine Bridge  - 1 x daily - 7 x weekly - 2 sets - 10 reps - 5 hold - Supine Hip Adduction Isometric  with Ball  - 1 x daily - 7 x weekly - 2 sets - 10 reps - 10'' hold - Hooklying Clamshell with Resistance  - 1 x daily - 7 x weekly - 3 sets - 10 reps - Bird Dog  - 1 x daily - 7 x weekly - 2 sets - 10 reps   ASSESSMENT:   CLINICAL IMPRESSION: Naveah continues to progress well with therapy.  Her pain levels are significantly lower at baseline since starting therapy.  She responds very well to Estim with TDN.  She is becoming more consistent  with HEP.  We will plan on D/C on visit 15x.  OBJECTIVE IMPAIRMENTS: Pain, lumbar ROM, gait     GOALS:     SHORT TERM GOALS:   Angelize will be >75% HEP compliant to improve carryover between sessions and facilitate independent management of condition   Evaluation (04/01/2022): ongoing Target date: 04/22/2022 Goal status: 04/16/22 MET     LONG TERM GOALS:   Kalie will improve FOTO score to 76 as a proxy for functional improvement   Evaluation/Baseline (04/01/2022): 37 Target date: 05/27/2022 6/1: 65 Goal status: MET     2.  Aleyna will self report >/= 50% decrease in pain from evaluation    Evaluation/Baseline (04/01/2022): 9/10 max pain Target date: 05/27/2022 6/1: MET 2/10 average Goal status: MET     3.  Roxi will be able to lift 15 lbs from the floor and place on a 3 foot counter, not limited by pain    Evaluation/Baseline (04/01/2022): unable Target date: 05/27/2022 Goal status: INITIAL      4.  Nalla will be able to walk for 30-40 min, not limited by pain    Evaluation/Baseline (04/01/2022): limited by pain Target date: 05/27/2022 6/1: plans to start today Goal status: ongoing       5.  Tylyn will be able to complete housework, not limited by pain   Evaluation/Baseline (04/01/2022): limited Target date: 05/27/2022 6/1: light housework Goal status: progressing       PLAN: PT FREQUENCY: 1-2x/week   PT DURATION: 8 weeks (Ending 05/27/2022)   PLANNED INTERVENTIONS: Therapeutic exercises, Aquatic therapy, Therapeutic activity, Neuro Muscular re-education, Gait training, Patient/Family education, Joint mobilization, Dry Needling, Electrical stimulation, Spinal mobilization and/or manipulation, Moist heat, Taping, Vasopneumatic device, Ionotophoresis 7m/ml Dexamethasone, and Manual therapy   PLAN FOR NEXT SESSION: TDN prn, manual prn, progressing hip and core strengthening, femoral nerve glide?     KKevan NyReinhartsen PT 05/19/22 4:11 PM

## 2022-05-21 ENCOUNTER — Ambulatory Visit: Payer: No Typology Code available for payment source | Admitting: Physical Therapy

## 2022-05-21 ENCOUNTER — Encounter: Payer: Self-pay | Admitting: Physical Therapy

## 2022-05-21 DIAGNOSIS — R2689 Other abnormalities of gait and mobility: Secondary | ICD-10-CM

## 2022-05-21 DIAGNOSIS — M545 Low back pain, unspecified: Secondary | ICD-10-CM

## 2022-05-21 DIAGNOSIS — M6281 Muscle weakness (generalized): Secondary | ICD-10-CM

## 2022-05-21 NOTE — Therapy (Signed)
OUTPATIENT PHYSICAL THERAPY TREATMENT NOTE   Patient Name: Kristy Walker MRN: [id number] DOB:[birth year], 61 y.o., female Today's Date: 05/21/2022  PCP: Marco Collie, MD REFERRING PROVIDER: Eustace Moore, MD  END OF SESSION:   PT End of Session - 05/21/22 1045     Visit Number 13    Number of Visits 15    Date for PT Re-Evaluation 05/27/22    Authorization Type [facility] - FOTO    Authorization Time Period 01/23/2022 -07/22/2022    Progress Note Due on Visit 10    PT Start Time 2725    PT Stop Time 1125    PT Time Calculation (min) 40 min    Activity Tolerance Patient tolerated treatment well    Behavior During Therapy [facility] for tasks assessed/performed                Past Medical History:  Diagnosis Date   Allergy    Anxiety    Depression    Hyperlipidemia    Sleep apnea    cpap   Past Surgical History:  Procedure Laterality Date   COLONOSCOPY     TONSILLECTOMY     april 2016   There are no problems to display for this patient.   REFERRING DIAG: Low back pain, unspecified back pain laterality, unspecified chronicity, unspecified whether sciatica present -   THERAPY DIAG:  Low back pain, unspecified back pain laterality, unspecified chronicity, unspecified whether sciatica present  Other abnormalities of gait and mobility  Muscle weakness  SUBJECTIVE:  Pt reports that she is little sore today.  She walked 10,000 steps yesterday.  Are you having pain?  No Pain location: L>R LBP NPRS scale:  current 2/10  Aggravating factors: walking for longer distances (30 min), standing for long periods Relieving factors: Ice Pain description: intermittent, constant, sharp, and aching Stage: Chronic Stability: staying the same 24 hour pattern: loosens in up after the morning and then worse with activity    Occupation: NA   Assistive Device: NA   Hand Dominance: NA   Patient Goals/Specific Activities: get back to baseline, fast walking (30 - 45 min)     PLOF:  Independent   DIAGNOSTIC FINDINGS: Disk bulge L4-L5     OBJECTIVE:    GENERAL OBSERVATION/GAIT:           Unable to toe walk d/t weakness and some pain in posterior legs to knees   SENSATION:          Light touch: Deficits toes of L foot   MUSCLE LENGTH: Hamstrings: Right no restriction; Left no restriction ASLR: Right ASLR significantly reduced compared to PSLR ; Left ASLR significantly reduced compared to PSLR                      Ely's: (+) for tightness and LBP bil   LUMBAR AROM   AROM AROM  04/01/2022  Flexion WNL, w/ no pain (gowers)  Extension limited by 50%, w/ concordant pain  Right lateral flexion WNL, w/ concordant pain  Left lateral flexion WNL  Right rotation limited by 50%  Left rotation limited by 50%    (Blank rows = not tested)   DIRECTIONAL PREFERENCE:           None clear   LE MMT:   MMT Right 04/01/2022 Left 04/01/2022  Hip flexion (L2, L3) N N  Knee extension (L3) N N  Knee flexion N N  Hip abduction      Hip extension  Hip external rotation      Hip internal rotation      Hip adduction      Ankle dorsiflexion (L4) N N  Ankle plantarflexion (S1) D D  Ankle inversion      Ankle eversion      Great Toe ext (L5)      Grossly        (Blank rows = not tested, score listed is out of 5 possible points.  N = WNL, D = diminished, C = clear for gross weakness with myotome testing, * = concordant pain with testing)     Functional Tests   Eval (04/01/2022)      Sustained supine bridge (dominant leg extended at 120'', if reached): NT d/t pain (norm 170'')                                                                                                      LUMBAR SPECIAL TESTS:  Straight leg raise: L (-), R (-) Slump: L (-), R (-)    PALPATION:            TTP and muscle tone lumbar paraspinals L>R with TTP and increased muscle tone L Iliocostalis and L QL    SPINAL SEGMENTAL MOBILITY ASSESSMENT:  Hypomobile with no radiation of  sxs   PATIENT SURVEYS:  FOTO 37 -> 54     ASTERISK SIGNS     Asterisk Signs Eval (04/01/2022) 5/19  5/25  6/8  6/15     Toe walk L unable            pain As high as 9/10, 6/10 average 4/10 average  2-3/10 average  1-2/10 average       Distance walked <30 min with pain        >30 min with low levels of pain    ASLR ASLR<PSLR    ASLR = PSLR        Elys test Concordant LBP                 HOME EXERCISE PROGRAM:  Access Code: [email or web address] URL: [email or web address] Date: 04/22/2022 Prepared by: Shearon Balo  Exercises - Supine Posterior Pelvic Tilt  - 2 x daily - 7 x weekly - 2 sets - 10 reps - 5'' hold - Static Prone on Elbows  - 1 x daily - 7 x weekly - 3 sets - 10 reps - Supine Lower Trunk Rotation  - 1 x daily - 7 x weekly - 2 sets - 10 reps - 5 hold - Supine Bridge  - 1 x daily - 7 x weekly - 2 sets - 10 reps - 5 hold - Supine Hip Adduction Isometric with Ball  - 1 x daily - 7 x weekly - 2 sets - 10 reps - 10'' hold - Hooklying Clamshell with Resistance  - 1 x daily - 7 x weekly - 3 sets - 10 reps  TODAY'S TREATMENT   [facility] Adult PT Treatment:  DATE: 05/21/2022 Therapeutic Exercise: [place] dog - 5'' hold - 3x10 S/L clam blue TB - 3x10 ea Dead bug 3x10 with physioball Sit to stand 15# - 2x10  Manual Therapy: STM and DTM to the lumbar paraspinals and QL Skilled palpation to identify trigger points for TDN  Trigger Point Dry-Needling  Treatment instructions: Expect mild to moderate muscle soreness. S/S of pneumothorax if dry needled over a lung field, and to seek immediate medical attention should they occur. Patient verbalized understanding of these instructions and education.   Patient Consent Given: Yes Education handout provided: No Muscles treated: lumbar multifidus, Iliocostalis lumborum Electrical stimulation performed: YES Parameters: 6 min low frequency - milli amps - low intensity, 6 min - micro amps -  high frequency high intensity Treatment response/outcome: reduction in muscle tone  [facility] Adult PT Treatment:                                                DATE: 05/19/2022 Therapeutic Exercise: Bird dog - 5'' hold - 3x10 S/L clam RTB - 3x10 ea Dead bug 3x10 with physioball Sit to stand 10# - 2x10  Manual Therapy: STM and DTM to the lumbar paraspinals and QL Skilled palpation to identify trigger points for TDN  Trigger Point Dry-Needling  Treatment instructions: Expect mild to moderate muscle soreness. S/S of pneumothorax if dry needled over a lung field, and to seek immediate medical attention should they occur. Patient verbalized understanding of these instructions and education.   Patient Consent Given: Yes Education handout provided: No Muscles treated: lumbar multifidus, Iliocostalis lumborum Electrical stimulation performed: YES Parameters: 6 min low frequency - milli amps - low intensity, 6 min - micro amps - high frequency high intensity Treatment response/outcome: reduction in muscle tone  [facility] Adult PT Treatment:                                                DATE: 05/14/2022 Therapeutic Exercise: Bird dog - 5'' hold - 2x10 S/L clam RTB - 2x10 ea Dead bug 3x10 with  physioball Sidelying clam - Blue TB POE x 60" Prone press up x 10  Manual Therapy: STM and DTM to the lumbar paraspinals and QL Skilled palpation to identify trigger points for TDN  Trigger Point Dry-Needling  Treatment instructions: Expect mild to moderate muscle soreness. S/S of pneumothorax if dry needled over a lung field, and to seek immediate medical attention should they occur. Patient verbalized understanding of these instructions and education.   Patient Consent Given: Yes Education handout provided: No Muscles treated: lumbar multifidus, Iliocostalis lumborum Electrical stimulation performed: YES Parameters: 5 min low frequency - milli amps - low intensity, 5 min - micro amps - high frequency  high intensity Treatment response/outcome: reduction in muscle tone  [facility] Adult PT Treatment:                                                DATE: 05/07/2022 Therapeutic Exercise: Bird dog - 5'' hold - 2x10 Dead bug 2x10 with  physioball POE x 60" Prone press up x 10  Manual Therapy:  STM and DTM to the lumbar paraspinals and QL Skilled palpation to identify trigger points for TDN  Trigger Point Dry-Needling  Treatment instructions: Expect mild to moderate muscle soreness. S/S of pneumothorax if dry needled over a lung field, and to seek immediate medical attention should they occur. Patient verbalized understanding of these instructions and education.   Patient Consent Given: Yes Education handout provided: No Muscles treated: lumbar multifidus, Iliocostalis lumborum Electrical stimulation performed: YES Parameters: 5 min low frequency - milli amps - low intensity, 5 min - micro amps - high frequency high intensity Treatment response/outcome: reduction in muscle tone  [facility] Adult PT Treatment:                                                DATE: 05/05/2022 Therapeutic Exercise: Bird dog - 5'' hold - 2x12 Isometric abdominal squeeze - 2x10 - 5'' squeeze Dead bug x 10 with  physioball POE x 60" Prone press up x 10 Manual Therapy: STM and DTM to the lumbar paraspinals and QL Skilled palpation to identify trigger points for TDN Trigger Point Dry-Needling  Treatment instructions: Expect mild to moderate muscle soreness. S/S of pneumothorax if dry needled over a lung field, and to seek immediate medical attention should they occur. Patient verbalized understanding of these instructions and education.   Patient Consent Given: Yes Education handout provided: No Muscles treated: lumbar multifidus, Iliocostalis lumborum Electrical stimulation performed: YES Parameters: 5 min low frequency - milli amps - low intensity, 5 min - micro amps - high frequency high intensity Treatment  response/outcome: reduction in muscle tone  [facility] Adult PT Treatment:                                                DATE: 04/30/2022 Therapeutic Exercise: Bridge 3x10 - 5" hold Alternating ASLR - 2x20 Bird dog - 5'' hold - 2x12 Isometric abdominal squeeze - 2x10 - 5'' squeeze  Manual Therapy: STM and DTM to the lumbar paraspinals and QL Skilled palpation to identify trigger points for TDN  Trigger Point Dry-Needling  Treatment instructions: Expect mild to moderate muscle soreness. S/S of pneumothorax if dry needled over a lung field, and to seek immediate medical attention should they occur. Patient verbalized understanding of these instructions and education.   Patient Consent Given: Yes Education handout provided: No Muscles treated: lumbar multifidus, Iliocostalis lumborum Electrical stimulation performed: YES Parameters: 5 min low frequency - milli amps - low intensity, 5 min - micro amps - high frequency high intensity Treatment response/outcome: reduction in muscle tone  [facility] Adult PT Treatment:                                                DATE: 04/28/2022 Therapeutic Exercise: Bridge x 20 - 3" hold Bird dog - 5'' hold - 2x10 Isometric abdominal squeeze - 2x10 - 5'' squeeze Manual Therapy: STM and DTM to the lumbar paraspinals and QL Skilled palpation to identify trigger points for TDN  Trigger Point Dry-Needling  Treatment instructions: Expect mild to moderate muscle soreness. S/S of pneumothorax if dry needled over a  lung field, and to seek immediate medical attention should they occur. Patient verbalized understanding of these instructions and education.   Patient Consent Given: Yes Education handout provided: No Muscles treated: lumbar multifidus, Iliocostalis lumborum Electrical stimulation performed: YES Parameters: 5 min low frequency - milli amps - low intensity, 5 min - micro amps - high frequency high intensity Treatment response/outcome: reduction in muscle  tone   HOME EXERCISE PROGRAM:             Access Code: [email or web address] URL: [email or web address] Date: 04/28/2022 Prepared by: Octavio Manns  Exercises - Supine Posterior Pelvic Tilt  - 2 x daily - 7 x weekly - 2 sets - 10 reps - 5'' hold - Static Prone on Elbows  - 1 x daily - 7 x weekly - 3 sets - 10 reps - Supine Lower Trunk Rotation  - 1 x daily - 7 x weekly - 2 sets - 10 reps - 5 hold - Supine Bridge  - 1 x daily - 7 x weekly - 2 sets - 10 reps - 5 hold - Supine Hip Adduction Isometric with Ball  - 1 x daily - 7 x weekly - 2 sets - 10 reps - 10'' hold - Hooklying Clamshell with Resistance  - 1 x daily - 7 x weekly - 3 sets - 10 reps - Bird Dog  - 1 x daily - 7 x weekly - 2 sets - 10 reps   ASSESSMENT:   CLINICAL IMPRESSION: Carrianne continues to progress well with therapy.  She continues to have relatively low levels of baseline pain.  She responds well to manual therapy and TDN with a significant reduction in pain today.  We were able to progress loaded sit to stand to 15# from lower surface today.  We will plan on D/C next visit.  OBJECTIVE IMPAIRMENTS: Pain, lumbar ROM, gait     GOALS:     SHORT TERM GOALS:   Chloe will be >75% HEP compliant to improve carryover between sessions and facilitate independent management of condition   Evaluation (04/01/2022): ongoing Target date: 04/22/2022 Goal status: 04/16/22 MET     LONG TERM GOALS:   Roslyn will improve FOTO score to 47 as a proxy for functional improvement   Evaluation/Baseline (04/01/2022): 37 Target date: 05/27/2022 6/1: 65 Goal status: MET     2.  Analy will self report >/= 50% decrease in pain from evaluation    Evaluation/Baseline (04/01/2022): 9/10 max pain Target date: 05/27/2022 6/1: MET 2/10 average Goal status: MET     3.  Sahasra will be able to lift 15 lbs from the floor and place on a 3 foot counter, not limited by pain    Evaluation/Baseline (04/01/2022): unable Target date: 05/27/2022 Goal  status: INITIAL      4.  Ashayla will be able to walk for 30-40 min, not limited by pain    Evaluation/Baseline (04/01/2022): limited by pain Target date: 05/27/2022 6/1: plans to start today Goal status: ongoing       5.  Tyese will be able to complete housework, not limited by pain   Evaluation/Baseline (04/01/2022): limited Target date: 05/27/2022 6/1: light housework Goal status: progressing       PLAN: PT FREQUENCY: 1-2x/week   PT DURATION: 8 weeks (Ending 05/27/2022)   PLANNED INTERVENTIONS: Therapeutic exercises, Aquatic therapy, Therapeutic activity, Neuro Muscular re-education, Gait training, Patient/Family education, Joint mobilization, Dry Needling, Electrical stimulation, Spinal mobilization and/or manipulation, Moist heat, Taping, Vasopneumatic device, Ionotophoresis 77m/ml Dexamethasone, and Manual  therapy   PLAN FOR NEXT SESSION: TDN prn, manual prn, progressing hip and core strengthening, femoral nerve glide?     Kevan Ny Dajha Urquilla PT 05/21/22 11:32 AM

## 2022-05-28 ENCOUNTER — Ambulatory Visit: Payer: No Typology Code available for payment source

## 2022-05-28 DIAGNOSIS — M6281 Muscle weakness (generalized): Secondary | ICD-10-CM

## 2022-05-28 DIAGNOSIS — M545 Low back pain, unspecified: Secondary | ICD-10-CM

## 2022-05-28 DIAGNOSIS — R2689 Other abnormalities of gait and mobility: Secondary | ICD-10-CM

## 2022-05-28 NOTE — Therapy (Signed)
OUTPATIENT PHYSICAL THERAPY TREATMENT NOTE/DISCHARGE  PHYSICAL THERAPY DISCHARGE SUMMARY  Visits from Start of Care: 14  Current functional level related to goals / functional outcomes: See goals and objective   Remaining deficits: See goals and objective   Education / Equipment: HEP   Patient agrees to discharge. Patient goals were met. Patient is being discharged due to meeting the stated rehab goals.   Patient Name: Kristy Walker MRN: [id number] DOB:30-Dec-1960, 61 y.o., female Today's Date: 05/28/2022  PCP: Marco Collie, MD REFERRING PROVIDER: Eustace Moore, MD  END OF SESSION:   PT End of Session - 05/28/22 0914     Visit Number 14    Number of Visits 15    Date for PT Re-Evaluation 05/27/22    [facility]ization Type [facility] - [facility]    Authorization Time Period 01/23/2022 -07/22/2022    Progress Note Due on Visit 10    PT Start Time 0915    PT Stop Time 0955    PT Time Calculation (min) 40 min    Activity Tolerance Patient tolerated treatment well    Behavior During Therapy [facility] for tasks assessed/performed                 Past Medical History:  Diagnosis Date   Allergy    Anxiety    Depression    Hyperlipidemia    Sleep apnea    cpap   Past Surgical History:  Procedure Laterality Date   COLONOSCOPY     TONSILLECTOMY     april 2016   There are no problems to display for this patient.   REFERRING DIAG: Low back pain, unspecified back pain laterality, unspecified chronicity, unspecified whether sciatica present -   THERAPY DIAG:  Low back pain, unspecified back pain laterality, unspecified chronicity, unspecified whether sciatica present  Other abnormalities of gait and mobility  Muscle weakness  SUBJECTIVE:  Pt presents to PT with reports of slight discomfort after increasing her walking activity. She has continued HEP compliance and feels ready for discharge from PT at this time.  Are you having pain?  No Pain location: L>R LBP NPRS scale:   current 3/10  Aggravating factors: walking for longer distances (30 min), standing for long periods Relieving factors: Ice Pain description: intermittent, constant, sharp, and aching Stage: Chronic Stability: staying the same 24 hour pattern: loosens in up after the morning and then worse with activity    OBJECTIVE:    GENERAL OBSERVATION/GAIT:           Unable to toe walk d/t weakness and some pain in posterior legs to knees   SENSATION:          Light touch: Deficits toes of L foot   MUSCLE LENGTH: Hamstrings: Right no restriction; Left no restriction ASLR: Right ASLR significantly reduced compared to PSLR ; Left ASLR significantly reduced compared to PSLR                      Ely's: (+) for tightness and LBP bil   LUMBAR AROM   AROM AROM  04/01/2022  Flexion WNL, w/ no pain (gowers)  Extension limited by 50%, w/ concordant pain  Right lateral flexion WNL, w/ concordant pain  Left lateral flexion WNL  Right rotation limited by 50%  Left rotation limited by 50%    (Blank rows = not tested)   DIRECTIONAL PREFERENCE:           None clear   LE MMT:  MMT Right 04/01/2022 Left 04/01/2022  Hip flexion (L2, L3) N N  Knee extension (L3) N N  Knee flexion N N  Hip abduction      Hip extension      Hip external rotation      Hip internal rotation      Hip adduction      Ankle dorsiflexion (L4) N N  Ankle plantarflexion (S1) D D  Ankle inversion      Ankle eversion      Great Toe ext (L5)      Grossly        (Blank rows = not tested, score listed is out of 5 possible points.  N = WNL, D = diminished, C = clear for gross weakness with myotome testing, * = concordant pain with testing)    ASTERISK SIGNS     Asterisk Signs Eval (04/01/2022) 5/19  5/25  6/8  6/15     Toe walk L unable            pain As high as 9/10, 6/10 average 4/10 average  2-3/10 average  1-2/10 average       Distance walked <30 min with pain        >30 min with low levels of pain    ASLR  ASLR<PSLR    ASLR = PSLR        Elys test Concordant LBP                 HOME EXERCISE PROGRAM:  Access Code: [email or web address] URL: [email or web address] Date: 04/22/2022 Prepared by: Shearon Balo  Exercises - Supine Posterior Pelvic Tilt  - 2 x daily - 7 x weekly - 2 sets - 10 reps - 5'' hold - Static Prone on Elbows  - 1 x daily - 7 x weekly - 3 sets - 10 reps - Supine Lower Trunk Rotation  - 1 x daily - 7 x weekly - 2 sets - 10 reps - 5 hold - Supine Bridge  - 1 x daily - 7 x weekly - 2 sets - 10 reps - 5 hold - Supine Hip Adduction Isometric with Ball  - 1 x daily - 7 x weekly - 2 sets - 10 reps - 10'' hold - Hooklying Clamshell with Resistance  - 1 x daily - 7 x weekly - 3 sets - 10 reps  TODAY'S TREATMENT   [facility] Adult PT Treatment:                                                DATE: 05/28/2022 Therapeutic Exercise: Supine PPT x 5 - 5" hold Supine PPT with hip add x 10 - 5" hold LTR x 10 Supine hamstring stretch 30" R  Bridge x 10 Bird dog - 5'' hold - x 10 S/L clam blue TB - 15 Trigger Point Dry-Needling  Treatment instructions: Expect mild to moderate muscle soreness. S/S of pneumothorax if dry needled over a lung field, and to seek immediate medical attention should they occur. Patient verbalized understanding of these instructions and education.   Patient Consent Given: Yes Education handout provided: No Muscles treated: lumbar multifidus, Iliocostalis lumborum Electrical stimulation performed: YES Parameters: 6 min low frequency - milli amps - low intensity, 6 min - micro amps - high frequency high intensity Treatment response/outcome: reduction in muscle tone  [facility] Adult PT  Treatment:                                                DATE: 05/21/2022 Therapeutic Exercise: [place] dog - 5'' hold - 3x10 S/L clam blue TB - 3x10 ea Dead bug 3x10 with physioball Sit to stand 15# - 2x10  Manual Therapy: STM and DTM to the lumbar paraspinals and QL Skilled  palpation to identify trigger points for TDN  Trigger Point Dry-Needling  Treatment instructions: Expect mild to moderate muscle soreness. S/S of pneumothorax if dry needled over a lung field, and to seek immediate medical attention should they occur. Patient verbalized understanding of these instructions and education.   Patient Consent Given: Yes Education handout provided: No Muscles treated: lumbar multifidus, Iliocostalis lumborum Electrical stimulation performed: YES Parameters: 6 min low frequency - milli amps - low intensity, 6 min - micro amps - high frequency high intensity Treatment response/outcome: reduction in muscle tone  [facility] Adult PT Treatment:                                                DATE: 05/19/2022 Therapeutic Exercise: Bird dog - 5'' hold - 3x10 S/L clam RTB - 3x10 ea Dead bug 3x10 with physioball Sit to stand 10# - 2x10  Manual Therapy: STM and DTM to the lumbar paraspinals and QL Skilled palpation to identify trigger points for TDN  Trigger Point Dry-Needling  Treatment instructions: Expect mild to moderate muscle soreness. S/S of pneumothorax if dry needled over a lung field, and to seek immediate medical attention should they occur. Patient verbalized understanding of these instructions and education.   Patient Consent Given: Yes Education handout provided: No Muscles treated: lumbar multifidus, Iliocostalis lumborum Electrical stimulation performed: YES Parameters: 6 min low frequency - milli amps - low intensity, 6 min - micro amps - high frequency high intensity Treatment response/outcome: reduction in muscle tone   HOME EXERCISE PROGRAM:             Access Code: [email or web address] URL: [email or web address] Date: 04/28/2022 Prepared by: Octavio Manns  Exercises - Supine Posterior Pelvic Tilt  - 2 x daily - 7 x weekly - 2 sets - 10 reps - 5'' hold - Static Prone on Elbows  - 1 x daily - 7 x weekly - 3 sets - 10 reps - Supine Lower Trunk  Rotation  - 1 x daily - 7 x weekly - 2 sets - 10 reps - 5 hold - Supine Bridge  - 1 x daily - 7 x weekly - 2 sets - 10 reps - 5 hold - Supine Hip Adduction Isometric with Ball  - 1 x daily - 7 x weekly - 2 sets - 10 reps - 10'' hold - Hooklying Clamshell with Resistance  - 1 x daily - 7 x weekly - 3 sets - 10 reps - Bird Dog  - 1 x daily - 7 x weekly - 2 sets - 10 reps   ASSESSMENT:   CLINICAL IMPRESSION: Pt was able to complete all prescribed exercises and demonstrated knowledge of HEP with no adverse effect. Over the course of PT treatment pt has been able to progress over the course of PT meeting all goals and improving functional  activity tolerance. She should continue to improve with HEP compliance and no longer requires skilled PT services. Pt in agreement with current plan and is ready to discharge at this time.   OBJECTIVE IMPAIRMENTS: Pain, lumbar ROM, gait     GOALS:     SHORT TERM GOALS:   Tandra will be >75% HEP compliant to improve carryover between sessions and facilitate independent management of condition   Evaluation (04/01/2022): ongoing Target date: 04/22/2022 Goal status: 04/16/22 MET     LONG TERM GOALS:   Kathi will improve FOTO score to 38 as a proxy for functional improvement   Evaluation/Baseline (04/01/2022): 37 Target date: 05/27/2022 6/1: 65 Goal status: MET     2.  Reema will self report >/= 50% decrease in pain from evaluation    Evaluation/Baseline (04/01/2022): 9/10 max pain Target date: 05/27/2022 6/1: MET 2/10 average Goal status: MET     3.  Tanish will be able to lift 15 lbs from the floor and place on a 3 foot counter, not limited by pain    Evaluation/Baseline (04/01/2022): unable Target date: 05/27/2022 Goal status: MET     4.  Rynlee will be able to walk for 30-40 min, not limited by pain    Evaluation/Baseline (04/01/2022): limited by pain Target date: 05/27/2022 6/1: plans to start today 05/28/2022: slight pain but not limiting  activity Goal status: MET       5.  Seraya will be able to complete housework, not limited by pain   Evaluation/Baseline (04/01/2022): limited Target date: 05/27/2022 6/1: light housework 05/28/2022: able to complete Goal status: MET       PLAN: PT FREQUENCY: 1-2x/week   PT DURATION: 8 weeks (Ending 05/27/2022)   PLANNED INTERVENTIONS: Therapeutic exercises, Aquatic therapy, Therapeutic activity, Neuro Muscular re-education, Gait training, Patient/Family education, Joint mobilization, Dry Needling, Electrical stimulation, Spinal mobilization and/or manipulation, Moist heat, Taping, Vasopneumatic device, Ionotophoresis 12m/ml Dexamethasone, and Manual therapy   PLAN FOR NEXT SESSION: TDN prn, manual prn, progressing hip and core strengthening, femoral nerve glide?     DWard ChattersPT 05/28/22 10:07 AM

## 2022-06-02 ENCOUNTER — Encounter: Payer: Self-pay | Admitting: Gastroenterology

## 2022-06-02 ENCOUNTER — Ambulatory Visit (INDEPENDENT_AMBULATORY_CARE_PROVIDER_SITE_OTHER): Admitting: Gastroenterology

## 2022-06-02 VITALS — BP 150/80 | HR 80 | Ht 65.0 in | Wt 209.2 lb

## 2022-06-02 DIAGNOSIS — R1032 Left lower quadrant pain: Secondary | ICD-10-CM

## 2022-06-02 DIAGNOSIS — R1031 Right lower quadrant pain: Secondary | ICD-10-CM | POA: Diagnosis not present

## 2022-06-02 DIAGNOSIS — K582 Mixed irritable bowel syndrome: Secondary | ICD-10-CM

## 2022-12-30 ENCOUNTER — Encounter: Payer: Self-pay | Admitting: Gastroenterology

## 2023-03-21 IMAGING — CT CT ABD-PELV W/ CM
2 of 5 series · 16 of 46 positions shown, 18 images · IV contrast (Omnipaque)
Comparison: None

CLINICAL DATA: Abdominal pain, lower abdominal pain in a
60-year-old female.

EXAM:
CT ABDOMEN AND PELVIS WITH CONTRAST
TECHNIQUE: Multidetector CT imaging of the abdomen and pelvis was performed
using the standard protocol following bolus administration of
intravenous contrast.
CONTRAST:  100mL OMNIPAQUE IOHEXOL 300 MG/ML  SOLN

[Series 2: axial st · axial · 0.95mm/px · z∈[-496,-96]mm · 13 of 90 slices shown, 15 images]
[im 5/90  soft-tissue]
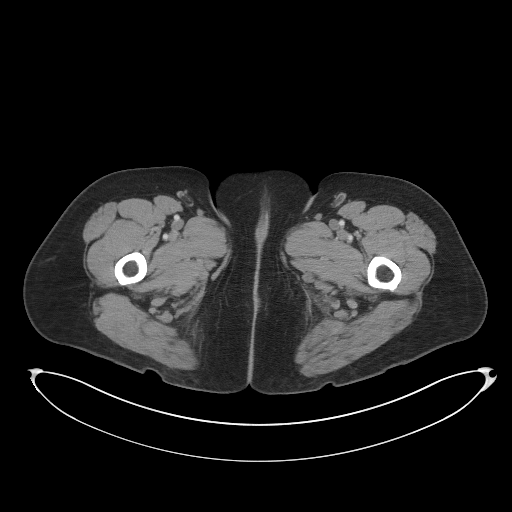
[im 5/90  bone]
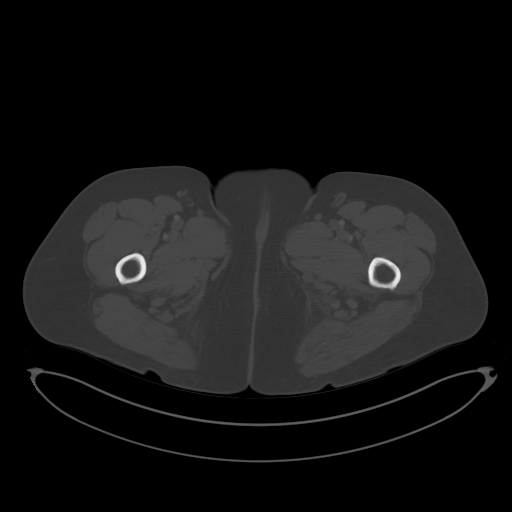
[im 10/90  soft-tissue]
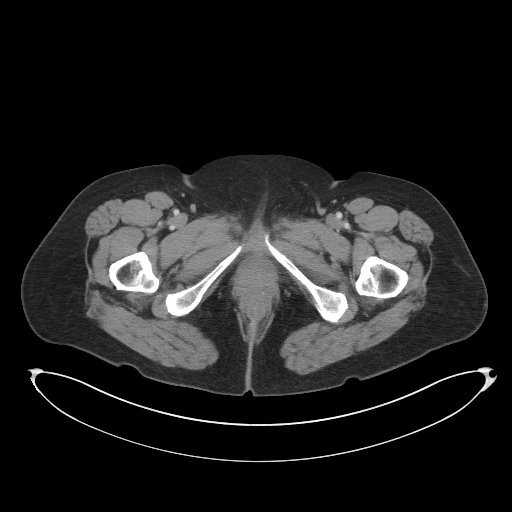
[im 20/90  soft-tissue]
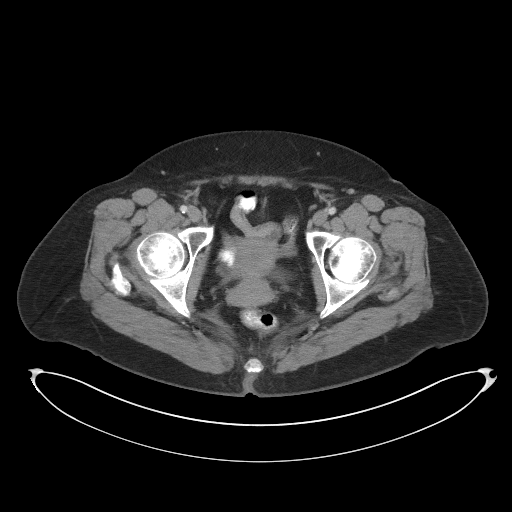
[im 25/90  soft-tissue]
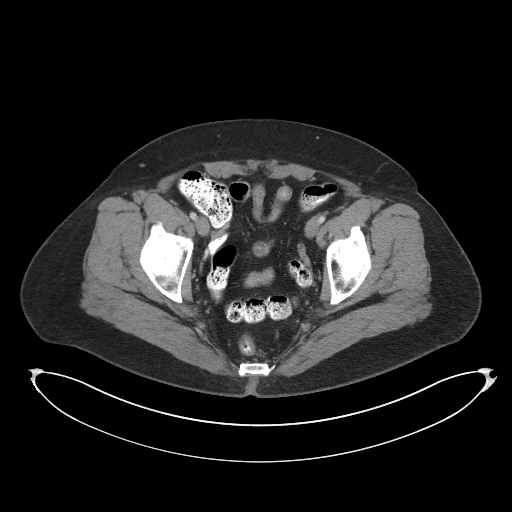
[im 30/90  soft-tissue]
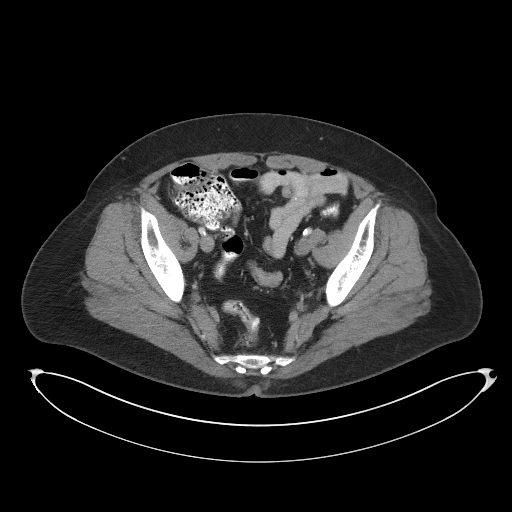
[im 40/90  soft-tissue]
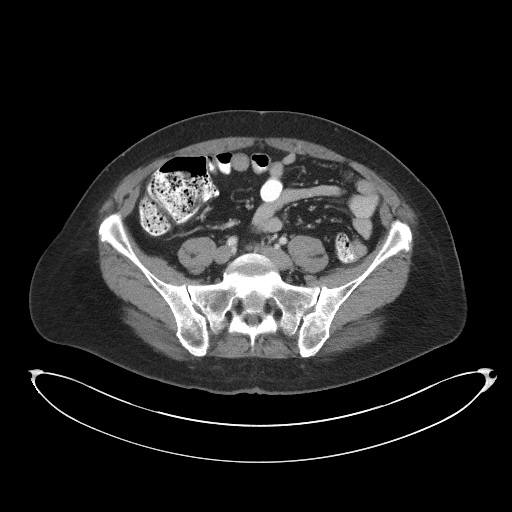
[im 45/90  soft-tissue]
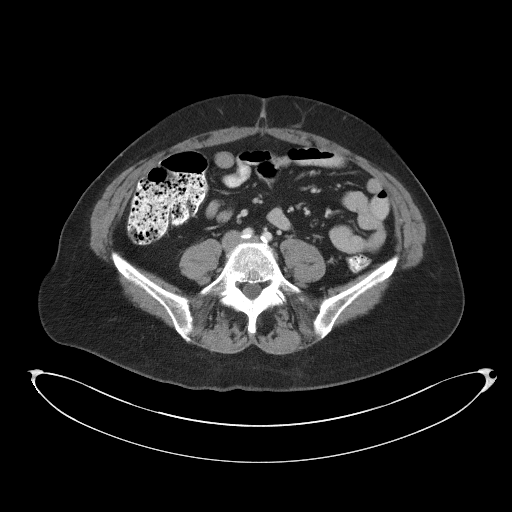
[im 50/90  soft-tissue]
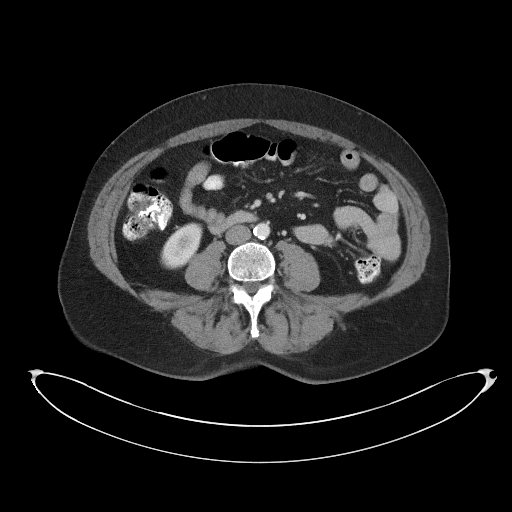
[im 60/90  soft-tissue]
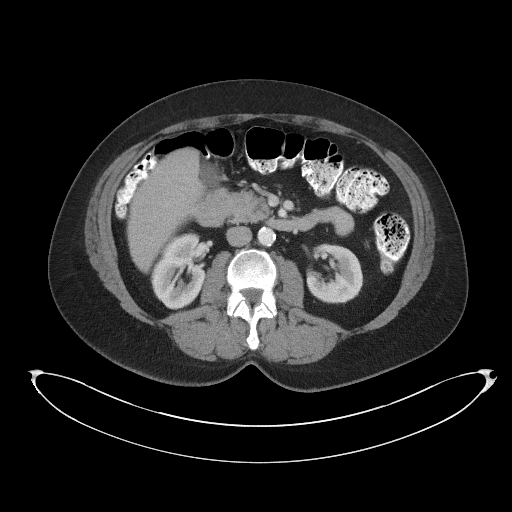
[im 60/90  bone]
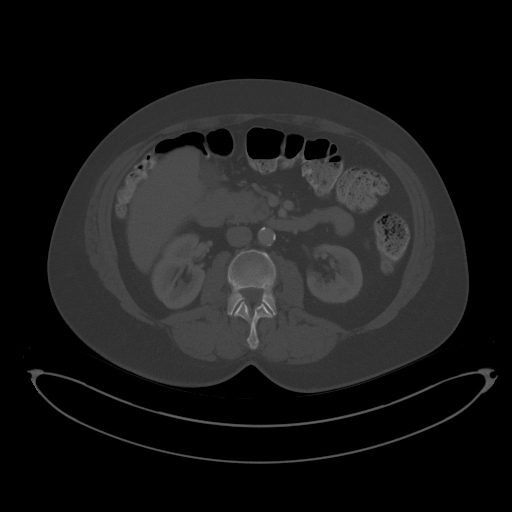
[im 65/90  soft-tissue]
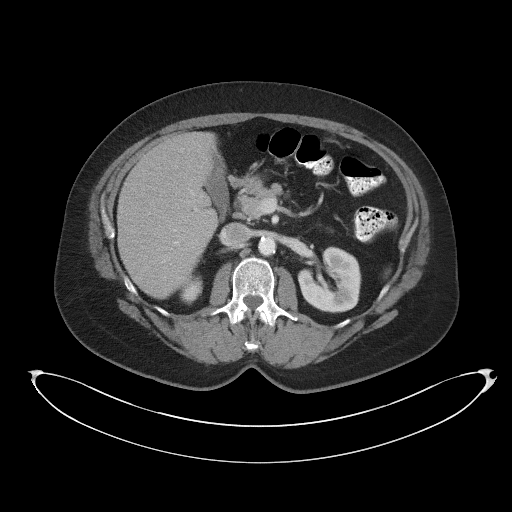
[im 70/90  soft-tissue]
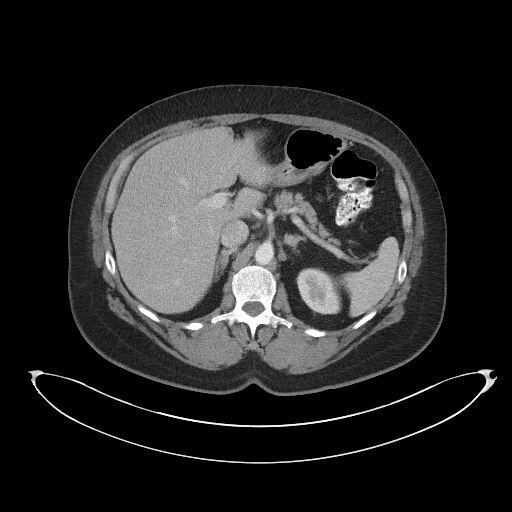
[im 80/90  soft-tissue]
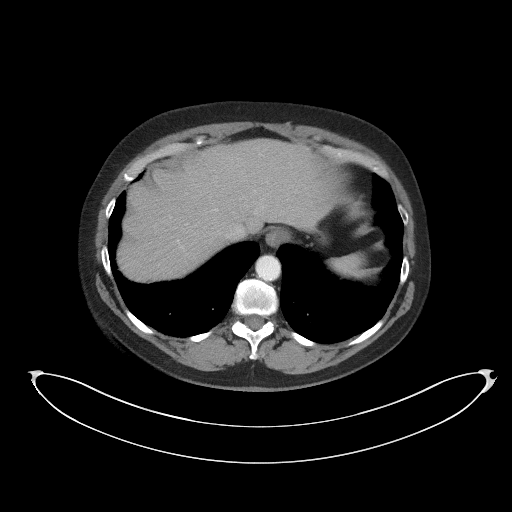
[im 85/90  soft-tissue]
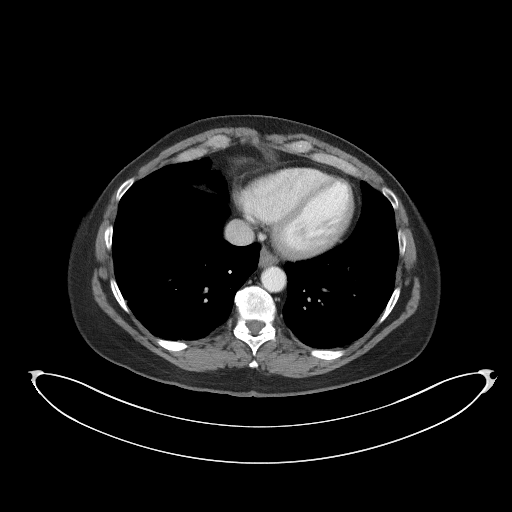

[Series 5: coronal st · coronal · 0.83mm/px · 3 of 100 slices shown]
[im 34/100  soft-tissue]
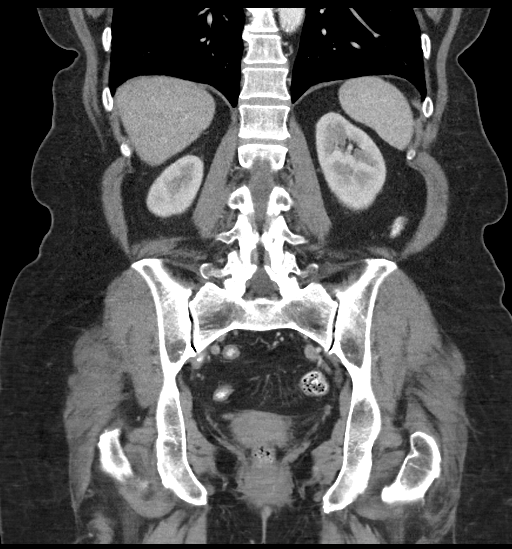
[im 45/100  soft-tissue]
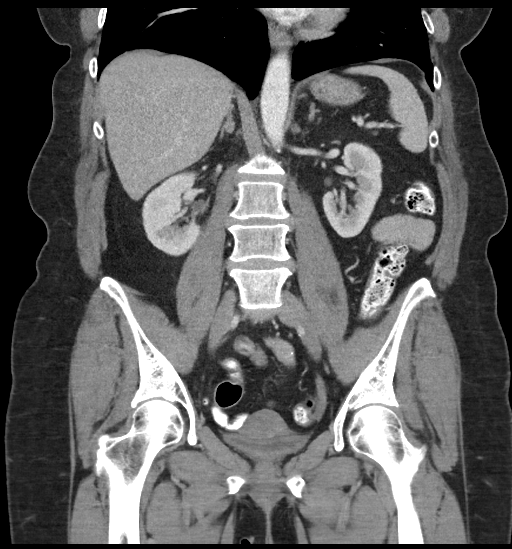
[im 56/100  soft-tissue]
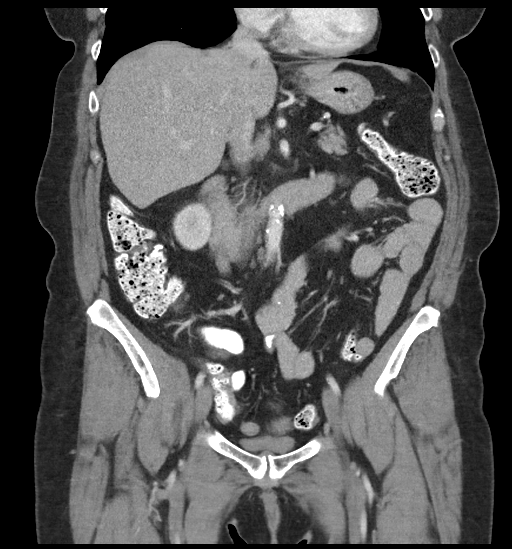

[16 of 46 positions shown; findings below may reference images not displayed]

FINDINGS: Lower chest: Ground-glass nodule at the LEFT lung base 5 mm (image
[DATE]) subpleural nodule at the RIGHT lung base 6 x 3 mm (image [DATE])
no effusion. No consolidative changes.

Hepatobiliary: No focal, suspicious hepatic lesion. No
pericholecystic stranding. No biliary duct dilation. Portal vein is
patent.

Pancreas: Normal, without mass, inflammation or ductal dilatation.

Spleen: Spleen normal size and contour without focal, suspicious
splenic lesion.

Adrenals/Urinary Tract: Adrenal glands are normal.

Symmetric renal enhancement. No sign of hydronephrosis. Urinary
bladder under distended, grossly unremarkable.

Stomach/Bowel: Gastrointestinal tract with no sign of acute process.
Appendix is normal. No perianal stranding. No pericolonic stranding.

Vascular/Lymphatic: Normal caliber abdominal aorta. Calcified and
noncalcified atheromatous plaque of the abdominal aorta. No
aneurysmal dilation. Smooth contour of the IVC. There is no
gastrohepatic or hepatoduodenal ligament lymphadenopathy. No
retroperitoneal or mesenteric lymphadenopathy.

No pelvic sidewall lymphadenopathy.

Reproductive: Unremarkable, no adnexal mass.

Other: No ascites.

Musculoskeletal: Spinal degenerative changes. No acute or
destructive bone process.
IMPRESSION: 1. No acute findings in the abdomen or pelvis.
2. Ground-glass nodule at the LEFT lung base 5 mm. 6 x 3 mm
subpleural nodule at the RIGHT lung base. Consider follow-up CT
chest at 3-6 months, further follow-up as dictated based on imaging
findings at that time. Mild subpleural reticulation is evident at
the lung bases, of uncertain significance. Attention on follow-up.
3. Aortic atherosclerosis.

Aortic Atherosclerosis ([id number]-[id number]).

## 2023-07-29 ENCOUNTER — Other Ambulatory Visit (INDEPENDENT_AMBULATORY_CARE_PROVIDER_SITE_OTHER)

## 2023-07-29 ENCOUNTER — Ambulatory Visit: Payer: No Typology Code available for payment source | Admitting: Gastroenterology

## 2023-07-29 ENCOUNTER — Encounter: Payer: Self-pay | Admitting: Gastroenterology

## 2023-07-29 VITALS — BP 120/66 | HR 76 | Ht 64.0 in | Wt 221.0 lb

## 2023-07-29 DIAGNOSIS — K581 Irritable bowel syndrome with constipation: Secondary | ICD-10-CM

## 2023-07-29 DIAGNOSIS — K219 Gastro-esophageal reflux disease without esophagitis: Secondary | ICD-10-CM | POA: Diagnosis not present

## 2023-07-29 DIAGNOSIS — D509 Iron deficiency anemia, unspecified: Secondary | ICD-10-CM

## 2023-07-29 DIAGNOSIS — R1032 Left lower quadrant pain: Secondary | ICD-10-CM | POA: Diagnosis not present

## 2023-07-29 LAB — COMPREHENSIVE METABOLIC PANEL
ALT: 16 U/L (ref 0–35)
AST: 13 U/L (ref 0–37)
Albumin: 4.2 g/dL (ref 3.5–5.2)
Alkaline Phosphatase: 97 U/L (ref 39–117)
BUN: 11 mg/dL (ref 6–23)
CO2: 27 meq/L (ref 19–32)
Calcium: 9.4 mg/dL (ref 8.4–10.5)
Chloride: 103 meq/L (ref 96–112)
Creatinine, Ser: 0.98 mg/dL (ref 0.40–1.20)
GFR: 61.52 mL/min (ref >60.00)
Glucose, Bld: 105 mg/dL — ABNORMAL HIGH (ref 70–99)
Potassium: 4.2 meq/L (ref 3.5–5.1)
Sodium: 139 meq/L (ref 135–145)
Total Bilirubin: 0.2 mg/dL (ref 0.2–1.2)
Total Protein: 7.3 g/dL (ref 6.0–8.3)

## 2023-07-29 LAB — CBC WITH DIFFERENTIAL/PLATELET
Basophils Absolute: 0.1 10*3/uL (ref 0.0–0.1)
Basophils Relative: 0.8 % (ref 0.0–3.0)
Eosinophils Absolute: 0.2 10*3/uL (ref 0.0–0.7)
Eosinophils Relative: 1.8 % (ref 0.0–5.0)
HCT: 40.4 % (ref 36.0–46.0)
Hemoglobin: 12.8 g/dL (ref 12.0–15.0)
Lymphocytes Relative: 20.1 % (ref 12.0–46.0)
Lymphs Abs: 2.1 10*3/uL (ref 0.7–4.0)
MCHC: 31.7 g/dL (ref 30.0–36.0)
MCV: 85.1 fl (ref 78.0–100.0)
Monocytes Absolute: 0.7 10*3/uL (ref 0.1–1.0)
Monocytes Relative: 6.5 % (ref 3.0–12.0)
Neutro Abs: 7.4 10*3/uL (ref 1.4–7.7)
Neutrophils Relative %: 70.8 % (ref 43.0–77.0)
Platelets: 287 10*3/uL (ref 150.0–400.0)
RBC: 4.75 Mil/uL (ref 3.87–5.11)
RDW: 16.1 % — ABNORMAL HIGH (ref 11.5–15.5)
WBC: 10.4 10*3/uL (ref 4.0–10.5)

## 2023-07-29 LAB — C-REACTIVE PROTEIN: CRP: 1.2 mg/dL (ref 0.5–20.0)

## 2023-07-29 MED ORDER — HYDROCORTISONE ACETATE 25 MG RE SUPP: 25.0000 mg | Freq: Two times a day (BID) | RECTAL | 2 refills | Status: DC

## 2023-07-29 NOTE — Progress Notes (Signed)
Chief Complaint: FU  Referring Provider:  Abner Greenspan, MD      ASSESSMENT AND PLAN;   #1. IDA  #2. GERD  #3. LLQ/RLQ/pelvic pain (resolved). Neg colon/CT 05/2021 as below.  #4. IBS-C with occ hoidal bleed Prev alt diarrhea. Neg celiac serology 11/2020, neg stool studies including C. difficile, GI panel, neg HP stool antigen. Neg colon 05/2021 with neg random colon Bx. Rpt in 10 yrs.  Plan:  -EGD (if anemic, then add colon as well) -CBC, CMP, iron studies, CRP -Has quit smoking -Continue nexium 20 BID -Add Mg 100mg  po every day  -Start miralax 17g MWF -Anusol HC supp 1 bid x 7 days 2RF  HPI:    Kristy Walker is a 62 y.o. 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 Had negative colonoscopy recently 05/2021.  It did show internal hemorrhoids.  Also she told me that was found to have IDA-I could not find any recent records regarding the same.  Still has alternating constipation with a rare diarrhea, abdominal bloating-attributed to IBS along with some lower abdominal crampy pain.  She had negative CT as below.  She has quit smoking.    Wt Readings from Last 3 Encounters:  07/29/23 221 lb (100.2 kg)  06/02/22 209 lb 4 oz (94.9 kg)  03/17/22 205 lb 2 oz (93 kg)    [facility]: #PPI therapy  CYP2C19 can impact metabolism of PPIs. Thomasene Ripple is a CYP2C19 intermediate  metabolizer and at increased risk for potential toxicity with long-term use of  certain PPIs. Has h/o GERD, currently on symptoms well controlled following  switch to esomeprazole. [facility] with plan for future EGD. May be possible  that prior smoking exacerbated GERD and Veteran may be able to manage symptoms  without medications in future. Will trial decrease in use  and pursue possible  de-escalation in future if possible.   - decrease esomeprazole to 20 mg once daily  - use TUMS PRN (Veteran prefers to obtain OTC)  - discussed TLCs to help manage symptoms  - order Mg in coordination with future appt    From previous notes: -C/O "Irregular BMs"-used to have more constipation, Dx with IBS-C, took Linzess and MiraLAX which resulted in more diarrhea.  Hence that was stopped.   -Reflux and is more or less "resolved" since she has stopped smoking.  He uses omeprazole on a as needed basis and has been using it 1 every 2 to 3 weeks.  No odynophagia or dysphagia.  She denies having any nausea/vomiting.  Wt Readings from Last 3 Encounters:  07/29/23 221 lb (100.2 kg)  06/02/22 209 lb 4 oz (94.9 kg)  03/17/22 205 lb 2 oz (93 kg)   Back injury Gained wt.  Past GI procedures: Colonoscopy 05/2021 - One 4 mm polyp in the mid sigmoid colon, removed with a cold snare. Bx-hyperplastic polyp - The entire examined colon is normal. Bx- neg for microscopic colitis. - Minimal sigmoid diverticulosis. - Rpt in 35yrs   Colon 11/2014: (PCF)Significant colonic polyps s/p polypectomy, internal and external hemorrhoids. Bx- TAs. Colon 03/2018:  - Colonic polyps s/p polypectomy. - Mild sigmoid diverticulosis. - Internal hemorrhoids and healed posterior anal fissure.  CT Abdo/pelvis 04/2021 1. No acute findings in the  abdomen or pelvis. 2. Ground-glass nodule at the LEFT lung base 5 mm. 6 x 3 mm subpleural nodule at the RIGHT lung base.  [facility] 3. Aortic atherosclerosis.  Past Medical History:  Diagnosis Date   Allergy    Anemia    Anxiety    Depression    Hyperlipidemia    Sleep apnea    cpap    Past Surgical History:  Procedure Laterality Date   COLONOSCOPY     TONSILLECTOMY     april 2016    Family History  Problem Relation Age of Onset   Colitis Mother    Heart attack Father    Colitis Sister    Depression Sister    Colon cancer Neg Hx     Esophageal cancer Neg Hx    Rectal cancer Neg Hx    Stomach cancer Neg Hx     Social History   Tobacco Use   Smoking status: Every Day   Smokeless tobacco: Never   Tobacco comments:     1 pack a day, smoking about 40 years  Vaping Use   Vaping status: Never Used  Substance Use Topics   Alcohol use: Yes    Comment: rarely   Drug use: Never    Current Outpatient Medications  Medication Sig Dispense Refill   Ascorbic Acid 500 MG CAPS Take by mouth.     aspirin EC 81 MG tablet Take 81 mg by mouth daily.     buPROPion (WELLBUTRIN SR) 150 MG 12 hr tablet Take 150 mg by mouth 2 (two) times daily.     clonazePAM (KLONOPIN) 0.5 MG tablet Take 0.5 mg by mouth as needed.     DULoxetine (CYMBALTA) 30 MG capsule Take 90 mg by mouth daily.     esomeprazole (NEXIUM) 20 MG capsule Take 20 mg by mouth 2 (two) times daily before a meal.     ferrous sulfate 325 (65 FE) MG tablet Take 325 mg by mouth 3 (three) times a week.     HYDROcodone-acetaminophen (NORCO/VICODIN) 5-325 MG tablet Take 1 tablet by mouth every 6 (six) hours as needed for moderate pain.     ibuprofen (ADVIL) 200 MG tablet Take 400 mg by mouth 2 (two) times daily.     lamoTRIgine (LAMICTAL) 25 MG tablet Take 50 mg by mouth 2 (two) times daily.     Multiple Vitamin (MULTIVITAMIN) tablet Take 1 tablet by mouth daily.     naloxone (NARCAN) nasal spray 4 mg/0.1 mL Place into the nose.     nicotine (NICODERM CQ - DOSED IN MG/24 HR) 7 mg/24hr patch Place onto the skin.     Omega-3 Fatty Acids (FISH-EPA PO) Take by mouth.     pravastatin (PRAVACHOL) 40 MG tablet TAKE ONE TABLET BY MOUTH AT BEDTIME FOR CHOLESTEROL     senna-docusate (SENOKOT-S) 8.6-50 MG tablet Take by mouth.     Tiotropium Bromide-Olodaterol 2.5-2.5 MCG/ACT AERS Inhale 2 puffs into the lungs daily.     VITAMIN D, CHOLECALCIFEROL, PO Take 2,000 Units by mouth daily.     Current Facility-Administered Medications  Medication Dose Route Frequency Provider Last Rate  Last Admin   0.9 %  sodium chloride infusion  500 mL Intravenous Once Lynann Bologna, MD        Allergies  Allergen Reactions   Trileptal [Oxcarbazepine]     Other Reaction(s): Feeling irritable    Review of Systems:  Psychiatric/Behavioral: Has anxiety or depression     Physical Exam:  BP 120/66 (BP Location: Left Arm, Patient Position: Sitting, Cuff Size: Normal)   Pulse 76   Ht 5\' 4"  (1.626 m)   Wt 221 lb (100.2 kg)   BMI 37.93 kg/m  Wt Readings from Last 3 Encounters:  07/29/23 221 lb (100.2 kg)  06/02/22 209 lb 4 oz (94.9 kg)  03/17/22 205 lb 2 oz (93 kg)   Constitutional:  Well-developed, in no acute distress. Psychiatric: Normal mood and affect. Behavior is normal. HEENT: Pupils normal.  Conjunctivae are normal. No scleral icterus. Abdominal: Soft, nondistended.  Mild bilateral lower abdominal tenderness. Bowel sounds active throughout. There are no masses palpable. No hepatomegaly. Neurological: Alert and oriented to person place and time. Skin: Skin is warm and dry. No rashes noted.     Edman Circle, MD 07/29/2023, 8:57 AM  Cc: Abner Greenspan, MD

## 2023-07-29 NOTE — Patient Instructions (Addendum)
_______________________________________________________  If your blood pressure at your visit was 140/90 or greater, please contact your primary care physician to follow up on this.  _______________________________________________________  If you are age 63 or older, your body mass index should be between 23-30. Your Body mass index is 37.93 kg/m. If this is out of the aforementioned range listed, please consider follow up with your Primary Care Provider.  If you are age 39 or younger, your body mass index should be between 19-25. Your Body mass index is 37.93 kg/m. If this is out of the aformentioned range listed, please consider follow up with your Primary Care Provider.   ________________________________________________________  The [facility] providers would like to encourage you to use [facility] to communicate with providers for non-urgent requests or questions.  Due to long hold times on the telephone, sending your provider a message by [facility] may be a faster and more efficient way to get a response.  Please allow 48 business hours for a response.  Please remember that this is for non-urgent requests.  _______________________________________________________  Your provider has requested that you go to the basement level for lab work before leaving today. Press "B" on the elevator. The lab is located at the first door on the left as you exit the elevator.  Continue nexium 2 times a day   Please purchase the following medications over the counter and take as directed: Magnesium 100mg  daily Miralax 17g Monday Wednesday and Friday  We have sent the following medications to your pharmacy for you to pick up at your convenience: Anusol Supp 1 2 times a day for 7 days   You have been scheduled for an endoscopy. Please follow written instructions given to you at your visit today.  If you use inhalers (even only as needed), please bring them with you on the day of your procedure.  If you  take any of the following medications, they will need to be adjusted prior to your procedure:   DO NOT TAKE 7 DAYS PRIOR TO TEST- Trulicity (dulaglutide) Ozempic, Wegovy (semaglutide) Mounjaro (tirzepatide) Bydureon Bcise (exanatide extended release)  DO NOT TAKE 1 DAY PRIOR TO YOUR TEST Rybelsus (semaglutide) Adlyxin (lixisenatide) Victoza (liraglutide) Byetta (exanatide) ___________________________________________________________________________   Thank you,  Dr. Lynann Bologna

## 2023-07-30 LAB — IBC + FERRITIN
Ferritin: 19.4 ng/mL (ref 10.0–291.0)
Iron: 162 ug/dL — ABNORMAL HIGH (ref 42–145)
Saturation Ratios: 39.9 % (ref 20.0–50.0)
TIBC: 406 ug/dL (ref 250.0–450.0)
Transferrin: 290 mg/dL (ref 212.0–360.0)

## 2023-08-02 ENCOUNTER — Encounter: Payer: Self-pay | Admitting: Gastroenterology

## 2023-08-02 ENCOUNTER — Ambulatory Visit: Payer: No Typology Code available for payment source | Admitting: Gastroenterology

## 2023-08-02 VITALS — BP 129/76 | HR 73 | Temp 98.9°F | Resp 14 | Ht 64.0 in | Wt 221.0 lb

## 2023-08-02 DIAGNOSIS — K2281 Esophageal polyp: Secondary | ICD-10-CM | POA: Diagnosis not present

## 2023-08-02 DIAGNOSIS — K2950 Unspecified chronic gastritis without bleeding: Secondary | ICD-10-CM

## 2023-08-02 DIAGNOSIS — K2970 Gastritis, unspecified, without bleeding: Secondary | ICD-10-CM

## 2023-08-02 DIAGNOSIS — D509 Iron deficiency anemia, unspecified: Secondary | ICD-10-CM | POA: Diagnosis not present

## 2023-08-02 DIAGNOSIS — D130 Benign neoplasm of esophagus: Secondary | ICD-10-CM

## 2023-08-02 MED ORDER — SODIUM CHLORIDE 0.9 % IV SOLN: 500.0000 mL | Freq: Once | INTRAVENOUS | Status: DC

## 2023-08-02 NOTE — Op Note (Signed)
[facility] Patient Name: Kristy Walker Procedure Date: 08/02/2023 3:48 PM MRN: [id number] Endoscopist: Lynann Bologna , MD, [id number] Age: 62 Referring MD:  Date of Birth: 02-04-1961 Gender: Female Account #: [id number] Procedure:                Upper GI endoscopy Indications:              Iron deficiency anemia with neg colon 2022. GERD Medicines:                Monitored Anesthesia Care Procedure:                Pre-Anesthesia Assessment:                           - Prior to the procedure, a History and Physical                            was performed, and patient medications and                            allergies were reviewed. The patient's tolerance of                            previous anesthesia was also reviewed. The risks                            and benefits of the procedure and the sedation                            options and risks were discussed with the patient.                            All questions were answered, and informed consent                            was obtained. Prior Anticoagulants: The patient has                            taken no anticoagulant or antiplatelet agents. ASA                            Grade Assessment: II - A patient with mild systemic                            disease. After reviewing the risks and benefits,                            the patient was deemed in satisfactory condition to                            undergo the procedure.                           After obtaining informed consent, the endoscope was  passed under direct vision. Throughout the                            procedure, the patient's blood pressure, pulse, and                            oxygen saturations were monitored continuously. The                            Olympus Scope [id number] was introduced through the                            mouth, and advanced to the second part of duodenum.                            The  upper GI endoscopy was accomplished without                            difficulty. The patient tolerated the procedure                            well. Scope In: Scope Out: Findings:                 A single 5 mm mucosal nodule was found in the mid                            esophagus, 22 cm from the incisors. The nodule was                            removed with a cold snare. Resection and retrieval                            were complete.                           The lower third of the esophagus was mildly                            tortuous with well-defined Z-line at 36 cm.                           A 2 cm hiatal hernia was present.                           Localized minimal inflammation characterized by                            erythema was found in the gastric antrum. Biopsies                            were taken with a cold forceps for histology.                           The  gastroesophageal flap valve was visualized                            endoscopically and classified as Hill Grade IV (no                            fold, wide open lumen, hiatal hernia present).                           The examined duodenum was normal. Biopsies for                            histology were taken with a cold forceps for                            evaluation of celiac disease. Complications:            No immediate complications. Estimated Blood Loss:     Estimated blood loss: none. Impression:               - Small hiatal hernia.                           - Minimal gastritis. Biopsied.                           - Incidental esophageal nodule s/p resection using                            a cold snare Recommendation:           - Patient has a contact number available for                            emergencies. The signs and symptoms of potential                            delayed complications were discussed with the                            patient. Return to normal activities  tomorrow.                            Written discharge instructions were provided to the                            patient.                           - Resume previous diet.                           - Continue present medications including Nexium.                            Can try reducing it to 20 mg p.o. QD.                           -  Brochures regarding GERD.                           - Await pathology results.                           - The findings and recommendations were discussed                            with the patient's family. Lynann Bologna, MD 08/02/2023 4:18:20 PM This report has been signed electronically.

## 2023-08-02 NOTE — Progress Notes (Unsigned)
Chief Complaint: FU  Referring Provider:  Abner Greenspan, MD      ASSESSMENT AND PLAN;   #1. IDA- better- almost resolved on Fe. Last CBC with Hb 12.8  #2. GERD  #3. LLQ/RLQ/pelvic pain (resolved). Neg colon/CT 05/2021 as below.  #4. IBS-C with occ hoidal bleed Prev alt diarrhea. Neg celiac serology 11/2020, neg stool studies including C. difficile, GI panel, neg HP stool antigen. Neg colon 05/2021 with neg random colon Bx. Rpt in 10 yrs.  Plan:    -EGD (if anemic, then add colon as well) -CBC, CMP, iron studies, CRP -Has quit smoking -Continue nexium 20 BID -Add Mg 100mg  po every day  -Start miralax 17g MWF -Anusol HC supp 1 bid x 7 days 2RF  HPI:    Kristy Walker is a 62 y.o. 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 Had negative colonoscopy recently 05/2021.  It did show internal hemorrhoids.  Also she told me that was found to have IDA-I could not find any recent records regarding the same.  Still has alternating constipation with a rare diarrhea, abdominal bloating-attributed to IBS along with some lower abdominal crampy pain.  She had negative CT as below.  She has quit smoking.    Wt Readings from Last 3 Encounters:  08/02/23 221 lb (100.2 kg)  07/29/23 221 lb (100.2 kg)  06/02/22 209 lb 4 oz (94.9 kg)    [facility] notes: #PPI therapy  CYP2C19 can impact metabolism of PPIs. Thomasene Ripple is a CYP2C19 intermediate  metabolizer and at increased risk for potential toxicity with long-term use of  certain PPIs. Has h/o GERD, currently on symptoms well controlled following  switch to esomeprazole. [facility] with plan for future EGD. May be possible  that prior smoking exacerbated GERD and Veteran may be able to manage symptoms   without medications in future. Will trial decrease in use and pursue possible  de-escalation in future if possible.   - decrease esomeprazole to 20 mg once daily  - use TUMS PRN (Veteran prefers to obtain OTC)  - discussed TLCs to help manage symptoms  - order Mg in coordination with future appt    From previous notes: -C/O "Irregular BMs"-used to have more constipation, Dx with IBS-C, took Linzess and MiraLAX which resulted in more diarrhea.  Hence that was stopped.   -Reflux and is more or less "resolved" since she has stopped smoking.  He uses omeprazole on a as needed basis and has been using it 1 every 2 to 3 weeks.  No odynophagia or dysphagia.  She denies having any nausea/vomiting.  Wt Readings from Last 3 Encounters:  08/02/23 221 lb (100.2 kg)  07/29/23 221 lb (100.2 kg)  06/02/22 209 lb 4 oz (94.9 kg)   Back injury Gained wt.  Past GI procedures: Colonoscopy 05/2021 - One 4 mm polyp in the mid sigmoid colon, removed with a cold snare. Bx-hyperplastic polyp - The entire examined colon is normal. Bx- neg for microscopic colitis. - Minimal sigmoid diverticulosis. - Rpt in 85yrs   Colon 11/2014: (PCF)Significant colonic polyps s/p polypectomy, internal and external hemorrhoids. Bx- TAs. Colon 03/2018:  - Colonic polyps s/p polypectomy. - Mild sigmoid diverticulosis. - Internal hemorrhoids and healed posterior anal fissure.  CT  Abdo/pelvis 04/2021 1. No acute findings in the abdomen or pelvis. 2. Ground-glass nodule at the LEFT lung base 5 mm. 6 x 3 mm subpleural nodule at the RIGHT lung base.  [facility] 3. Aortic atherosclerosis.  Past Medical History:  Diagnosis Date   Allergy    Anemia    Anxiety    Depression    Hyperlipidemia    Sleep apnea    cpap    Past Surgical History:  Procedure Laterality Date   COLONOSCOPY     TONSILLECTOMY     april 2016    Family History  Problem Relation Age of Onset   Colitis Mother    Heart attack Father     Colitis Sister    Depression Sister    Colon cancer Neg Hx    Esophageal cancer Neg Hx    Rectal cancer Neg Hx    Stomach cancer Neg Hx     Social History   Tobacco Use   Smoking status: Former    Types: Cigarettes   Smokeless tobacco: Never   Tobacco comments:    Last cigarette was May 17, 2023, using nicotine patches currently  Vaping Use   Vaping status: Never Used  Substance Use Topics   Alcohol use: Yes    Comment: malted beverage once weekly   Drug use: Never    Current Outpatient Medications  Medication Sig Dispense Refill   Ascorbic Acid 500 MG CAPS Take by mouth.     aspirin EC 81 MG tablet Take 81 mg by mouth daily.     buPROPion (WELLBUTRIN SR) 150 MG 12 hr tablet Take 150 mg by mouth 2 (two) times daily.     clonazePAM (KLONOPIN) 0.5 MG tablet Take 0.5 mg by mouth as needed.     DULoxetine (CYMBALTA) 30 MG capsule Take 90 mg by mouth daily.     esomeprazole (NEXIUM) 20 MG capsule Take 20 mg by mouth 2 (two) times daily before a meal.     ferrous sulfate 325 (65 FE) MG tablet Take 325 mg by mouth 3 (three) times a week.     hydrocortisone (ANUSOL-HC) 25 MG suppository Place 1 suppository (25 mg total) rectally 2 (two) times daily. For 7 days 14 suppository 2   ibuprofen (ADVIL) 200 MG tablet Take 400 mg by mouth 2 (two) times daily.     lamoTRIgine (LAMICTAL) 25 MG tablet Take 50 mg by mouth 2 (two) times daily.     Multiple Vitamin (MULTIVITAMIN) tablet Take 1 tablet by mouth daily.     nicotine (NICODERM CQ - DOSED IN MG/24 HR) 7 mg/24hr patch Place onto the skin.     pravastatin (PRAVACHOL) 40 MG tablet TAKE ONE TABLET BY MOUTH AT BEDTIME FOR CHOLESTEROL     senna-docusate (SENOKOT-S) 8.6-50 MG tablet Take by mouth.     Tiotropium Bromide-Olodaterol 2.5-2.5 MCG/ACT AERS Inhale 2 puffs into the lungs daily.     VITAMIN D, CHOLECALCIFEROL, PO Take 2,000 Units by mouth daily.     HYDROcodone-acetaminophen (NORCO/VICODIN) 5-325 MG tablet Take 1 tablet by mouth  every 6 (six) hours as needed for moderate pain.     naloxone (NARCAN) nasal spray 4 mg/0.1 mL Place into the nose.     Omega-3 Fatty Acids (FISH-EPA PO) Take by mouth.     Current Facility-Administered Medications  Medication Dose Route Frequency Provider Last Rate Last Admin   0.9 %  sodium chloride infusion  500 mL Intravenous Once Lynann Bologna, MD  0.9 %  sodium chloride infusion  500 mL Intravenous Once Lynann Bologna, MD        Allergies  Allergen Reactions   Trileptal [Oxcarbazepine]     Other Reaction(s): Feeling irritable    Review of Systems:  Psychiatric/Behavioral: Has anxiety or depression     Physical Exam:    BP 129/69   Pulse 78   Temp 98.9 F (37.2 C) (Other (Comment)) Comment (Src): forehead  Ht 5\' 4"  (1.626 m)   Wt 221 lb (100.2 kg)   SpO2 94%   BMI 37.93 kg/m  Wt Readings from Last 3 Encounters:  08/02/23 221 lb (100.2 kg)  07/29/23 221 lb (100.2 kg)  06/02/22 209 lb 4 oz (94.9 kg)   Constitutional:  Well-developed, in no acute distress. Psychiatric: Normal mood and affect. Behavior is normal. HEENT: Pupils normal.  Conjunctivae are normal. No scleral icterus. Abdominal: Soft, nondistended.  Mild bilateral lower abdominal tenderness. Bowel sounds active throughout. There are no masses palpable. No hepatomegaly. Neurological: Alert and oriented to person place and time. Skin: Skin is warm and dry. No rashes noted.     Edman Circle, MD 08/02/2023, 3:52 PM  Cc: Abner Greenspan, MD

## 2023-08-02 NOTE — Patient Instructions (Signed)
Can try reducing Nexium to 20 mg PO daily YOU HAD AN ENDOSCOPIC PROCEDURE TODAY: Refer to the procedure report and other information in the discharge instructions given to you for any specific questions about what was found during the examination. If this information does not answer your questions, please call [place] office at [phone] to clarify.   YOU SHOULD EXPECT: Some feelings of bloating in the abdomen. Passage of more gas than usual. Walking can help get rid of the air that was put into your GI tract during the procedure and reduce the bloating. If you had a lower endoscopy (such as a colonoscopy or flexible sigmoidoscopy) you may notice spotting of blood in your stool or on the toilet paper. Some abdominal soreness may be present for a day or two, also.  DIET: Your first meal following the procedure should be a light meal and then it is ok to progress to your normal diet. A half-sandwich or bowl of soup is an example of a good first meal. Heavy or fried foods are harder to digest and may make you feel nauseous or bloated. Drink plenty of fluids but you should avoid alcoholic beverages for 24 hours. If you had a esophageal dilation, please see attached instructions for diet.    ACTIVITY: Your care partner should take you home directly after the procedure. You should plan to take it easy, moving slowly for the rest of the day. You can resume normal activity the day after the procedure however YOU SHOULD NOT DRIVE, use power tools, machinery or perform tasks that involve climbing or major physical exertion for 24 hours (because of the sedation medicines used during the test).   SYMPTOMS TO REPORT IMMEDIATELY: A gastroenterologist can be reached at any hour. Please call [phone]  for any of the following symptoms:  Following upper endoscopy (EGD, EUS, ERCP, esophageal dilation) Vomiting of blood or coffee ground material  New, significant abdominal pain  New, significant chest pain or pain  under the shoulder blades  Painful or persistently difficult swallowing  New shortness of breath  Black, tarry-looking or red, bloody stools  FOLLOW UP:  If any biopsies were taken you will be contacted by phone or by letter within the next 1-3 weeks. Call [phone]  if you have not heard about the biopsies in 3 weeks.  Please also call with any specific questions about appointments or follow up tests.

## 2023-08-02 NOTE — Progress Notes (Unsigned)
To pacu, VSS. Report to [email or web address] 

## 2023-08-03 ENCOUNTER — Telehealth: Payer: Self-pay

## 2023-08-03 NOTE — Telephone Encounter (Signed)
Follow up call to pt, lm for pt to call if having any difficulty with normal activities or eating and drinking.  Also to call if any other questions or concerns.  

## 2023-08-14 ENCOUNTER — Encounter: Payer: Self-pay | Admitting: Gastroenterology

## 2023-09-03 ENCOUNTER — Ambulatory Visit: Payer: No Typology Code available for payment source | Attending: Neurological Surgery

## 2023-09-03 ENCOUNTER — Other Ambulatory Visit: Payer: Self-pay

## 2023-09-03 DIAGNOSIS — R2689 Other abnormalities of gait and mobility: Secondary | ICD-10-CM | POA: Diagnosis present

## 2023-09-03 DIAGNOSIS — M6281 Muscle weakness (generalized): Secondary | ICD-10-CM | POA: Diagnosis present

## 2023-09-03 DIAGNOSIS — M545 Low back pain, unspecified: Secondary | ICD-10-CM | POA: Insufficient documentation

## 2023-09-16 ENCOUNTER — Telehealth: Payer: Self-pay | Admitting: Gastroenterology

## 2023-09-16 NOTE — Telephone Encounter (Signed)
PT had and EGD on 8/26 and her anemia issue is still not solved. She in still bleeding somewhere and it has her concerned. Please advis.

## 2023-09-17 ENCOUNTER — Telehealth: Payer: Self-pay | Admitting: Gastroenterology

## 2023-09-17 NOTE — Telephone Encounter (Signed)
Kristy Walker from the [place] is calling to get pathology reports from 8/26. Please fax to [phone] attn: [facility]

## 2023-09-17 NOTE — Therapy (Signed)
OUTPATIENT PHYSICAL THERAPY TREATMENT NOTE   Patient Name: Kristy Walker MRN: [id number] DOB:Apr 05, 1961, 62 y.o., female Today's Date: 09/20/2023  END OF SESSION:  PT End of Session - 09/20/23 0958     Visit Number 2    Number of Visits 12    Date for PT Re-Evaluation 11/03/23    Authorization Type [employer]    PT Start Time 1000    PT Stop Time 1040    PT Time Calculation (min) 40 min    Activity Tolerance Patient tolerated treatment well    Behavior During Therapy [facility] for tasks assessed/performed              Past Medical History:  Diagnosis Date   Allergy    Anemia    Anxiety    Depression    Hyperlipidemia    Sleep apnea    cpap   Past Surgical History:  Procedure Laterality Date   COLONOSCOPY     TONSILLECTOMY     april 2016   There are no problems to display for this patient.   PCP: Abner Greenspan, MD   REFERRING PROVIDER: Arman Bogus, MD  REFERRING DIAG: [phone] (ICD-10-CM) - Radiculopathy, lumbar region  Rationale for Evaluation and Treatment: Rehabilitation  THERAPY DIAG:  Low back pain, unspecified back pain laterality, unspecified chronicity, unspecified whether sciatica present  Other abnormalities of gait and mobility  Muscle weakness  ONSET DATE: chronic  SUBJECTIVE:                                                                                                                                                                                           SUBJECTIVE STATEMENT: Has only had one chance to perform HEP as she was on vacation.  Spent time at [employer] and was fairly active.  Today, describes chronic tightness across low back, 4/10 symptom intensity.    PERTINENT HISTORY:  Chronic low back pain w/o radicular symptoms  PAIN:  Are you having pain? Yes: NPRS scale: 8/10 Pain location: low back Pain description: ache Aggravating factors: prolonged walking, house work Relieving factors: bending , stretching  PRECAUTIONS:  Back  RED FLAGS: None   WEIGHT BEARING RESTRICTIONS: No  FALLS:  Has patient fallen in last 6 months? No  OCCUPATION: not working  PLOF: Independent  PATIENT GOALS: To lessen my symptoms and return to walking   NEXT MD VISIT: PRN  OBJECTIVE:   DIAGNOSTIC FINDINGS:  None available  PATIENT SURVEYS:  FOTO 53(60 predicted)   MUSCLE LENGTH: Hamstrings: Right 90 deg; Left 90 deg PKB: Right 110 deg; Left 110 deg  POSTURE: decreased lumbar lordosis  PALPATION: Increased tension in lumbar paraspinals  LUMBAR ROM:   AROM eval  Flexion   Extension   Right lateral flexion   Left lateral flexion   Right rotation 50%  Left rotation 50%   (Blank rows = not tested)  LOWER EXTREMITY ROM:   WNL B  Active  Right eval Left eval  Hip flexion    Hip extension    Hip abduction    Hip adduction    Hip internal rotation    Hip external rotation    Knee flexion    Knee extension    Ankle dorsiflexion    Ankle plantarflexion    Ankle inversion    Ankle eversion     (Blank rows = not tested)  LOWER EXTREMITY MMT:    MMT Right eval Left eval  Hip flexion    Hip extension    Hip abduction    Hip adduction    Hip internal rotation    Hip external rotation    Knee flexion    Knee extension    Ankle dorsiflexion    Ankle plantarflexion    Ankle inversion    Ankle eversion    Trunk/core 3+ 3+   (Blank rows = not tested)  LUMBAR SPECIAL TESTS:  Straight leg raise test: Negative, Slump test: Negative, and Thomas test: Negative  FUNCTIONAL TESTS:  5 times sit to stand: 15s arms crossed  GAIT: Distance walked: 43ftx2 Assistive device utilized: None Level of assistance: Complete Independence Comments: unremarkable  TODAY'S TREATMENT:      [facility] Adult PT Treatment:                                                DATE: 09/20/23 Therapeutic Exercise: Nustep L4 8 min QL stretch 30s x2 B PPT 3s hold 10x PPT w/march 2.5# 10/10 90/90 30s x2 Curl ups with  p-ball 15x B, 15/15 unilaterally Manual Therapy: Skilled palpation to identify taught and irritable bands in B multifidi Trigger Point Dry Needling Treatment: Pre-treatment instruction: Patient instructed on dry needling rationale, procedures, and possible side effects including pain during treatment (achy, cramping feeling), bruising, drop of blood, lightheadedness, nausea, sweating. Patient Consent Given: Yes Education handout provided: No Muscles treated: B multifidi  Needle size and number: .30x9mm x 2 Electrical stimulation performed: No Parameters: N/A Treatment response/outcome: Twitch response elicited and Palpable decrease in muscle tension Post-treatment instructions: Patient instructed to expect possible mild to moderate muscle soreness later today and/or tomorrow. Patient instructed in methods to reduce muscle soreness and to continue prescribed HEP. If patient was dry needled over the lung field, patient was instructed on signs and symptoms of pneumothorax and, however unlikely, to see immediate medical attention should they occur. Patient was also educated on signs and symptoms of infection and to seek medical attention should they occur. Patient verbalized understanding of these instructions and education.  DATE: 09/03/23 Eval and HEP    PATIENT EDUCATION:  Education details: Discussed eval findings, rehab rationale and POC and patient is in agreement  Person educated: Patient Education method: Explanation Education comprehension: verbalized understanding and needs further education  HOME EXERCISE PROGRAM: Access Code: [email or web address] URL: [email or web address] Date: 09/03/2023 Prepared by: Gustavus Bryant  Exercises - Supine Posterior Pelvic Tilt  - 5 x weekly - 2 sets - 10 reps - 3'' hold - Supine March with Posterior Pelvic Tilt  - 2 x  daily - 5 x weekly - 2 sets - 10 reps - Supine Quadratus Lumborum Stretch  - 2 x daily - 5 x weekly - 1 sets - 2 reps - 30s hold  ASSESSMENT:  CLINICAL IMPRESSION: Began session with TPDN to lumbar region followed by HEP review and aerobic work.  Focused on flexibility and core work.  Discussed need to stay active but avoid strenuous tasks following TPDN.   (Eval)Patient is a 62 y.o. female who was seen today for physical therapy evaluation and treatment for chronic low back pain w/o radicular symptoms.   OBJECTIVE IMPAIRMENTS: decreased activity tolerance, decreased endurance, decreased knowledge of condition, decreased mobility, difficulty walking, decreased ROM, decreased strength, increased fascial restrictions, increased muscle spasms, improper body mechanics, and postural dysfunction.   ACTIVITY LIMITATIONS: carrying, lifting, bending, standing, and squatting  PARTICIPATION LIMITATIONS: cleaning and yard work  PERSONAL FACTORS: Fitness, Past/current experiences, and Time since onset of injury/illness/exacerbation are also affecting patient's functional outcome.   REHAB POTENTIAL: Good  CLINICAL DECISION MAKING: Stable/uncomplicated  EVALUATION COMPLEXITY: Low   GOALS: Goals reviewed with patient? No  SHORT TERM GOALS: Target date: 10/01/2023  Patient to demonstrate independence in HEP  Baseline: [id number] Goal status: INITIAL  2.  Decrease pain to 6/10 at worst Baseline: 8/10 Goal status: INITIAL   LONG TERM GOALS: Target date: 10/29/2023    Patient will score at least 60% on FOTO to signify clinically meaningful improvement in functional abilities.   Baseline: 53 Goal status: INITIAL  2.  Decrease 5s STS time to <15s Baseline: 15s Goal status: INITIAL  3.  Decrease worst pain to 4/10 Baseline: 8/10 Goal status: INITIAL  4.  Decrease intersegmental stiffness from marked to moderate Baseline: Moderate stiffness Goal status: INITIAL  5.  Increase trunk  rotation to 75% B Baseline: 50% B Goal status: INITIAL  6.  Increase trunk strength to 4/5 Baseline: 3+/5 Goal status: INITIAL  PLAN:  PT FREQUENCY: 1-2x/week  PT DURATION: 6 weeks  PLANNED INTERVENTIONS: Therapeutic exercises, Therapeutic activity, Neuromuscular re-education, Balance training, Gait training, Patient/Family education, Self Care, Joint mobilization, Aquatic Therapy, Dry Needling, Electrical stimulation, Spinal mobilization, Cryotherapy, Moist heat, Manual therapy, and Re-evaluation.  PLAN FOR NEXT SESSION: HEP review and update, manual techniques as appropriate, aerobic tasks, ROM and flexibility activities, strengthening and PREs, TPDN, gait and balance training as needed     Hildred Laser, PT 09/20/2023, 10:40 AM

## 2023-09-17 NOTE — Telephone Encounter (Signed)
Faxed to number provided

## 2023-09-17 NOTE — Telephone Encounter (Signed)
Returned patient call & she would like to be seen sooner for an appointment d/t rectal bleeding. This has been ongoing since her OV with Dr. Chales Abrahams in August, however she feels that it's worsened. She's experiencing rectal bleeding daily when she sits down. She does have some occasional lower abdominal pain that feels similar to menstrual pain. She was told that she had internal hemorrhoids, however feels that this is different. Scheduled patient for OV 09/24/23 at 3:00 pm with Marchelle Folks, [place]. Discussed ED precautions with patient if bleeding worsens or if patient begins to feel symptomatic. Pt verbalized all understanding.

## 2023-09-17 NOTE — Telephone Encounter (Signed)
Recent labs from 08/12/23 at the [place] are available in care everywhere under 09/16/23 visit encounter.

## 2023-09-20 ENCOUNTER — Ambulatory Visit: Payer: No Typology Code available for payment source | Attending: Neurological Surgery

## 2023-09-20 DIAGNOSIS — M6281 Muscle weakness (generalized): Secondary | ICD-10-CM | POA: Diagnosis present

## 2023-09-20 DIAGNOSIS — M545 Low back pain, unspecified: Secondary | ICD-10-CM | POA: Diagnosis present

## 2023-09-20 DIAGNOSIS — R2689 Other abnormalities of gait and mobility: Secondary | ICD-10-CM | POA: Diagnosis present

## 2023-09-23 ENCOUNTER — Encounter: Admitting: Physical Therapy

## 2023-09-24 ENCOUNTER — Other Ambulatory Visit (INDEPENDENT_AMBULATORY_CARE_PROVIDER_SITE_OTHER)

## 2023-09-24 ENCOUNTER — Encounter: Payer: Self-pay | Admitting: Physician Assistant

## 2023-09-24 ENCOUNTER — Ambulatory Visit (INDEPENDENT_AMBULATORY_CARE_PROVIDER_SITE_OTHER): Admitting: Physician Assistant

## 2023-09-24 VITALS — BP 136/70 | HR 78 | Ht 64.0 in | Wt 225.0 lb

## 2023-09-24 DIAGNOSIS — K581 Irritable bowel syndrome with constipation: Secondary | ICD-10-CM | POA: Insufficient documentation

## 2023-09-24 DIAGNOSIS — K625 Hemorrhage of anus and rectum: Secondary | ICD-10-CM

## 2023-09-24 DIAGNOSIS — E785 Hyperlipidemia, unspecified: Secondary | ICD-10-CM | POA: Insufficient documentation

## 2023-09-24 DIAGNOSIS — K219 Gastro-esophageal reflux disease without esophagitis: Secondary | ICD-10-CM | POA: Diagnosis not present

## 2023-09-24 DIAGNOSIS — E669 Obesity, unspecified: Secondary | ICD-10-CM | POA: Insufficient documentation

## 2023-09-24 DIAGNOSIS — R918 Other nonspecific abnormal finding of lung field: Secondary | ICD-10-CM | POA: Insufficient documentation

## 2023-09-24 DIAGNOSIS — R1030 Lower abdominal pain, unspecified: Secondary | ICD-10-CM | POA: Insufficient documentation

## 2023-09-24 DIAGNOSIS — F319 Bipolar disorder, unspecified: Secondary | ICD-10-CM | POA: Insufficient documentation

## 2023-09-24 DIAGNOSIS — J449 Chronic obstructive pulmonary disease, unspecified: Secondary | ICD-10-CM | POA: Insufficient documentation

## 2023-09-24 DIAGNOSIS — D509 Iron deficiency anemia, unspecified: Secondary | ICD-10-CM

## 2023-09-24 DIAGNOSIS — Z122 Encounter for screening for malignant neoplasm of respiratory organs: Secondary | ICD-10-CM | POA: Insufficient documentation

## 2023-09-24 DIAGNOSIS — G4733 Obstructive sleep apnea (adult) (pediatric): Secondary | ICD-10-CM | POA: Insufficient documentation

## 2023-09-24 DIAGNOSIS — F32A Depression, unspecified: Secondary | ICD-10-CM | POA: Insufficient documentation

## 2023-09-24 DIAGNOSIS — K5904 Chronic idiopathic constipation: Secondary | ICD-10-CM | POA: Diagnosis not present

## 2023-09-24 DIAGNOSIS — F5104 Psychophysiologic insomnia: Secondary | ICD-10-CM | POA: Insufficient documentation

## 2023-09-24 LAB — COMPREHENSIVE METABOLIC PANEL
ALT: 22 U/L (ref 0–35)
AST: 16 U/L (ref 0–37)
Albumin: 4.2 g/dL (ref 3.5–5.2)
Alkaline Phosphatase: 98 U/L (ref 39–117)
BUN: 9 mg/dL (ref 6–23)
CO2: 29 meq/L (ref 19–32)
Calcium: 9.6 mg/dL (ref 8.4–10.5)
Chloride: 102 meq/L (ref 96–112)
Creatinine, Ser: 0.85 mg/dL (ref 0.40–1.20)
GFR: 72.9 mL/min (ref >60.00)
Glucose, Bld: 108 mg/dL — ABNORMAL HIGH (ref 70–99)
Potassium: 4.2 meq/L (ref 3.5–5.1)
Sodium: 138 meq/L (ref 135–145)
Total Bilirubin: 0.2 mg/dL (ref 0.2–1.2)
Total Protein: 7 g/dL (ref 6.0–8.3)

## 2023-09-24 LAB — CBC WITH DIFFERENTIAL/PLATELET
Basophils Absolute: 0.1 10*3/uL (ref 0.0–0.1)
Basophils Relative: 1 % (ref 0.0–3.0)
Eosinophils Absolute: 0.2 10*3/uL (ref 0.0–0.7)
Eosinophils Relative: 1.8 % (ref 0.0–5.0)
HCT: 32.7 % — ABNORMAL LOW (ref 36.0–46.0)
Hemoglobin: 10.7 g/dL — ABNORMAL LOW (ref 12.0–15.0)
Lymphocytes Relative: 26.5 % (ref 12.0–46.0)
Lymphs Abs: 2.6 10*3/uL (ref 0.7–4.0)
MCHC: 32.6 g/dL (ref 30.0–36.0)
MCV: 89.7 fL (ref 78.0–100.0)
Monocytes Absolute: 0.7 10*3/uL (ref 0.1–1.0)
Monocytes Relative: 6.7 % (ref 3.0–12.0)
Neutro Abs: 6.4 10*3/uL (ref 1.4–7.7)
Neutrophils Relative %: 64 % (ref 43.0–77.0)
Platelets: 336 10*3/uL (ref 150.0–400.0)
RBC: 3.65 Mil/uL — ABNORMAL LOW (ref 3.87–5.11)
RDW: 14.5 % (ref 11.5–15.5)
WBC: 10 10*3/uL (ref 4.0–10.5)

## 2023-09-24 LAB — IBC + FERRITIN
Ferritin: 22.6 ng/mL (ref 10.0–291.0)
Iron: 36 ug/dL — ABNORMAL LOW (ref 42–145)
Saturation Ratios: 8.6 % — ABNORMAL LOW (ref 20.0–50.0)
TIBC: 417.2 ug/dL (ref 250.0–450.0)
Transferrin: 298 mg/dL (ref 212.0–360.0)

## 2023-09-24 MED ORDER — HYDROCORTISONE ACETATE 25 MG RE SUPP: 25.0000 mg | Freq: Two times a day (BID) | RECTAL | 2 refills | Status: DC

## 2023-09-24 MED ORDER — LUBIPROSTONE 8 MCG PO CAPS: 8.0000 ug | ORAL_CAPSULE | Freq: Two times a day (BID) | ORAL | 3 refills | Status: DC

## 2023-09-24 NOTE — Patient Instructions (Addendum)
Your provider has requested that you go to the basement level for lab work before leaving today. Press "B" on the elevator. The lab is located at the first door on the left as you exit the elevator.  We have set up a hemorrhoid banding on 11/25/2023 at 11:20am   Amitiza 8 mcg twice a day.  Please do sitz baths- these can be found at the pharmacy. It is a Chief Operating Officer that is put in your toliet.  Please increase fiber or add benefiber, increase water and increase acitivity.  Will send in hydrocoritsone suppository, cheapest with [employer] from [employer], [employer], [employer] or [facility] if your insurance does not pay for it. If the hemorrhoid suppository sent in is too expensive you can do this over the counter trick.  Apply a pea size amount of generic prescription Anusol HC cream that has been sent into your pharmacy to the tip of an over the counter PrepH suppository and insert rectally once every night for at least 7 nights.  If this does not improve there are procedures that can be done.   About Hemorrhoids  Hemorrhoids are swollen veins in the lower rectum and anus.  Also called piles, hemorrhoids are a common problem.  Hemorrhoids may be internal (inside the rectum) or external (around the anus).  Internal Hemorrhoids  Internal hemorrhoids are often painless, but they rarely cause bleeding.  The internal veins may stretch and fall down (prolapse) through the anus to the outside of the body.  The veins may then become irritated and painful.  External Hemorrhoids  External hemorrhoids can be easily seen or felt around the anal opening.  They are under the skin around the anus.  When the swollen veins are scratched or broken by straining, rubbing or wiping they sometimes bleed.  How Hemorrhoids Occur  Veins in the rectum and around the anus tend to swell under pressure.  Hemorrhoids can result from increased pressure in the veins of your anus or rectum.  Some sources of pressure  are:  Straining to have a bowel movement because of constipation Waiting too long to have a bowel movement Coughing and sneezing often Sitting for extended periods of time, including on the toilet Diarrhea Obesity Trauma or injury to the anus Some liver diseases Stress Family history of hemorrhoids Pregnancy  Pregnant women should try to avoid becoming constipated, because they are more likely to have hemorrhoids during pregnancy.  In the last trimester of pregnancy, the enlarged uterus may press on blood vessels and causes hemorrhoids.  In addition, the strain of childbirth sometimes causes hemorrhoids after the birth.  Symptoms of Hemorrhoids  Some symptoms of hemorrhoids include: Swelling and/or a tender lump around the anus Itching, mild burning and bleeding around the anus Painful bowel movements with or without constipation Bright red blood covering the stool, on toilet paper or in the toilet bowel.   Symptoms usually go away within a few days.  Always talk to your doctor about any bleeding to make sure it is not from some other causes.  Diagnosing and Treating Hemorrhoids  Diagnosis is made by an examination by your healthcare provider.  Special test can be performed by your doctor.    Most cases of hemorrhoids can be treated with: High-fiber diet: Eat more high-fiber foods, which help prevent constipation.  Ask for more detailed fiber information on types and sources of fiber from your healthcare provider. Fluids: Drink plenty of water.  This helps soften bowel movements so they are  easier to pass. Sitz baths and cold packs: Sitting in lukewarm water two or three times a day for 15 minutes cleases the anal area and may relieve discomfort.  If the water is too hot, swelling around the anus will get worse.  Placing a cloth-covered ice pack on the anus for ten minutes four times a day can also help reduce selling.  Gently pushing a prolapsed hemorrhoid back inside after the bath  or ice pack can be helpful. Medications: For mild discomfort, your healthcare provider may suggest over-the-counter pain medication or prescribe a cream or ointment for topical use.  The cream may contain witch hazel, zinc oxide or petroleum jelly.  Medicated suppositories are also a treatment option.  Always consult your doctor before applying medications or creams. Procedures and surgeries: There are also a number of procedures and surgeries to shrink or remove hemorrhoids in more serious cases.  Talk to your physician about these options.  You can often prevent hemorrhoids or keep them from becoming worse by maintaining a healthy lifestyle.  Eat a fiber-rich diet of fruits, vegetables and whole grains.  Also, drink plenty of water and exercise regularly.   2007, [employer] Doc.30

## 2023-09-24 NOTE — Progress Notes (Signed)
09/24/2023 Kristy Walker [id number] [birth year]  Referring provider: No ref. provider found Primary GI doctor: Dr. Chales Abrahams  ASSESSMENT AND PLAN:  Rectal Bleeding with history of iron deficiency anemia at the [facility] 07/29/2023  HGB 12.8 MCV 85.1 Platelets 287.0 07/29/2023 Iron 162 Ferritin 19.4 while on iron -Colonoscopy 2022 unremarkable recall 10 years - 07/2023 EGD with no evidence for anemia Large internal hemorrhoids identified on anoscopy, possible source for the anemia. -Recheck iron levels to assess for anemia while on oral iron -Schedule hemorrhoid banding with Dr. Barron Alvine -Given suppositories, try Amitiza to help with constipation, has failed Linzess 72 mcg due to diarrhea and MiraLAX and fiber -Continue iron supplementation for now but consider trial off after banding to see if anemia returns -Consider capsule endoscopy if anemia persists after banding, I do not believe colonoscopy is needed at this time  Abdominal Discomfort likely secondary to chronic idiopathic constipation/IBS CT unremarkable 2022 Described as similar to menstrual cramps. Relieved by heat application. No associated diarrhea or constipation.No straining but prolonged sitting for bowel movements due to gas. -Discontinue current laxatives and start Amitiza BID -Reevaluate symptoms after medication changes and post-banding.      Patient Care Team: Pcp, No as PCP - General  HISTORY OF PRESENT ILLNESS: 62 y.o. female with a past medical history of hyperlipidemia, OSA, anxiety, anemia and others listed below presents for evaluation of rectal bleeding.  05/2021 colonoscopy with 4 mm hyperplastic polyp sigmoid negative microscopic colitis biopsies sigmoid diverticulosis recall 10 years 07/29/2023 office visit with Dr. Chales Abrahams for constipation abdominal pain and anemia continued on Nexium 20 mg daily added magnesium and MiraLAX with Anusol suppositories 08/02/2023 EGD Dr. Chales Abrahams for anemia showed small  hiatal hernia minimal gastritis incidental esophageal nodule status postresection pathology negative for infection negative celiac benign papilloma of the esophagus no need for recall 09/16/2023 called with continuing rectal bleeding with associated lower abdominal discomfort patient felt it was worsened.  Discussed the use of AI scribe software for clinical note transcription with the patient, who gave verbal consent to proceed.  The patient, with a history of anemia, presents with worsening rectal bleeding. She describes the bleeding as large volume, sometimes with clots, and the color of the toilet water as red. The bleeding is intermittent, with some days having no blood in the stool. The patient denies any rectal pain, burning, or itching.  The patient also reports abdominal discomfort, which she describes as similar to menstrual cramps. The discomfort is relieved by using a heating pad and lying down. The patient also reports irregular bowel movements, with some days having no bowel movement and other days having loose stools. The patient describes the stools as light-brown thin, and similar to "rabbit herd looking." The patient denies straining during bowel movements but reports having to sit for a long time for bowel movements, up to 30 mins,  to occur due to gas.  The patient is currently taking over-the-counter ibuprofen twice a day and occasionally consumes alcohol. She quit smoking and denies any drug use. The patient also reports taking iron supplements for anemia.  She denies blood thinner use.  She reports NSAID use, ibuprofen 600 mg BID.  She reports ETOH use, once a week or less, more during the summer.  She denies tobacco use.  She denies drug use.    Wt Readings from Last 3 Encounters:  09/24/23 225 lb (102.1 kg)  08/02/23 221 lb (100.2 kg)  07/29/23 221 lb (100.2 kg)  She  reports that she has quit smoking. Her smoking use included cigarettes. She has never used smokeless  tobacco. She reports current alcohol use. She reports that she does not use drugs.  Past GI procedures: Colonoscopy 05/2021 - One 4 mm polyp in the mid sigmoid colon, removed with a cold snare. Bx-hyperplastic polyp - The entire examined colon is normal. Bx- neg for microscopic colitis. - Minimal sigmoid diverticulosis. - Rpt in 25yrs    Colon 11/2014: ([facility])Significant colonic polyps s/p polypectomy, internal and external hemorrhoids. Bx- TAs. Colon 03/2018:  - Colonic polyps s/p polypectomy. - Mild sigmoid diverticulosis. - Internal hemorrhoids and healed posterior anal fissure.   CT Abdo/pelvis 04/2021 1. No acute findings in the abdomen or pelvis. 2. Ground-glass nodule at the LEFT lung base 5 mm. 6 x 3 mm subpleural nodule at the RIGHT lung base.  [facility]. Aortic atherosclerosis.  RELEVANT LABS AND IMAGING:  Results   Procedure: Anoscopy Description: Anoscope inserted into the rectum. Large internal hemorrhoids observed. Two columns of hemorrhoids noted, one with visible blood. No masses detected.  RADIOLOGY CT scan: Showed area of concern (2022)  DIAGNOSTIC Endoscopy: No significant findings Colonoscopy: Performed (2022)      CBC    Component Value Date/Time   WBC 10.4 07/29/2023 0942   RBC 4.75 07/29/2023 0942   HGB 12.8 07/29/2023 0942   HCT 40.4 07/29/2023 0942   PLT 287.0 07/29/2023 0942   MCV 85.1 07/29/2023 0942   MCHC 31.7 07/29/2023 0942   RDW 16.1 (H) 07/29/2023 0942   LYMPHSABS 2.1 07/29/2023 0942   MONOABS 0.7 07/29/2023 0942   EOSABS 0.2 07/29/2023 0942   BASOSABS 0.1 07/29/2023 0942   Recent Labs    07/29/23 0942  HGB 12.8    CMP     Component Value Date/Time   NA 139 07/29/2023 0942   K 4.2 07/29/2023 0942   CL 103 07/29/2023 0942   CO2 27 07/29/2023 0942   GLUCOSE 105 (H) 07/29/2023 0942   BUN 11 07/29/2023 0942   CREATININE 0.98 07/29/2023 0942   CALCIUM 9.4 07/29/2023 0942   PROT 7.3 07/29/2023 0942   ALBUMIN 4.2  07/29/2023 0942   AST 13 07/29/2023 0942   ALT 16 07/29/2023 0942   ALKPHOS 97 07/29/2023 0942   BILITOT 0.2 07/29/2023 0942      Latest Ref Rng & Units 07/29/2023    9:42 AM 04/14/2021   10:09 AM  Hepatic Function  Total Protein 6.0 - 8.3 g/dL 7.3  6.8   Albumin 3.5 - 5.2 g/dL 4.2  4.4   AST 0 - 37 U/L 13  19   ALT 0 - 35 U/L 16  18   Alk Phosphatase 39 - 117 U/L 97  88   Total Bilirubin 0.2 - 1.2 mg/dL 0.2  0.3       Current Medications:    Current Outpatient Medications (Cardiovascular):    pravastatin (PRAVACHOL) 40 MG tablet, TAKE ONE TABLET BY MOUTH AT BEDTIME FOR CHOLESTEROL  Current Outpatient Medications (Respiratory):    Tiotropium Bromide-Olodaterol 2.5-2.5 MCG/ACT AERS, Inhale 2 puffs into the lungs daily.  Current Outpatient Medications (Analgesics):    aspirin EC 81 MG tablet, Take 81 mg by mouth daily.   HYDROcodone-acetaminophen (NORCO/VICODIN) 5-325 MG tablet, Take 1 tablet by mouth every 6 (six) hours as needed for moderate pain.   ibuprofen (ADVIL) 200 MG tablet, Take 400 mg by mouth 2 (two) times daily.  Current Outpatient Medications (Hematological):  ferrous sulfate 325 (65 FE) MG tablet, Take 325 mg by mouth 3 (three) times a week.  Current Outpatient Medications (Other):    buPROPion (WELLBUTRIN SR) 150 MG 12 hr tablet, Take 150 mg by mouth 2 (two) times daily.   DULoxetine (CYMBALTA) 30 MG capsule, Take 90 mg by mouth daily.   esomeprazole (NEXIUM) 20 MG capsule, Take 20 mg by mouth 2 (two) times daily before a meal.   lamoTRIgine (LAMICTAL) 25 MG tablet, Take 50 mg by mouth 2 (two) times daily.   lubiprostone (AMITIZA) 8 MCG capsule, Take 1 capsule (8 mcg total) by mouth 2 (two) times daily with a meal.   magnesium oxide (MAG-OX) 400 (240 Mg) MG tablet, Take 400 mg by mouth daily. Pt taking 420 mg   Multiple Vitamin (MULTIVITAMIN) tablet, Take 1 tablet by mouth daily.   naloxone (NARCAN) nasal spray 4 mg/0.1 mL, Place into the nose.   nicotine  (NICODERM CQ - DOSED IN MG/24 HR) 7 mg/24hr patch, Place onto the skin.   Omega-3 Fatty Acids (FISH-EPA PO), Take by mouth.   senna-docusate (SENOKOT-S) 8.6-50 MG tablet, Take by mouth.   VITAMIN D, CHOLECALCIFEROL, PO, Take 2,000 Units by mouth daily.   clonazePAM (KLONOPIN) 0.5 MG tablet, Take 0.5 mg by mouth as needed. (Patient not taking: Reported on 09/24/2023)   hydrocortisone (ANUSOL-HC) 25 MG suppository, Place 1 suppository (25 mg total) rectally 2 (two) times daily. For 7 days  Medical History:  Past Medical History:  Diagnosis Date   Allergy    Anemia    Anxiety    Depression    Hyperlipidemia    Sleep apnea    cpap   Allergies:  Allergies  Allergen Reactions   Trileptal [Oxcarbazepine]     Other Reaction(s): Feeling irritable     Surgical History:  She  has a past surgical history that includes Colonoscopy and Tonsillectomy. Family History:  Her family history includes Colitis in her mother and sister; Depression in her sister; Heart attack in her father.  REVIEW OF SYSTEMS  : All other systems reviewed and negative except where noted in the History of Present Illness.  PHYSICAL EXAM: BP 136/70   Pulse 78   Ht 5\' 4"  (1.626 m)   Wt 225 lb (102.1 kg)   BMI 38.62 kg/m  General Appearance: Well nourished, in no apparent distress. Head:   Normocephalic and atraumatic. Eyes:  sclerae anicteric,conjunctive pink  Respiratory: Respiratory effort normal, BS equal bilaterally without rales, rhonchi, wheezing. Cardio: RRR with no MRGs. Peripheral pulses intact.  Abdomen: Soft,  Obese ,active bowel sounds. mild tenderness in the lower abdomen. Without guarding and Without rebound. No masses. Rectal: Anoscopy used for evaluation showed normal external rectal exam with some hemorrhoidal polyps, internal exam with large grade 2-3 vascular hemorrhoidal at right posterior and left lateral column, no masses or fissures identified, nontender examination large volume soft brown  stool, Hemoccult not done with obvious bleeding from hemorrhoids Musculoskeletal: Full ROM, Normal gait. Without edema. Skin:  Dry and intact without significant lesions or rashes Neuro: Alert and  oriented x4;  No focal deficits. Psych:  Cooperative. Normal mood and affect.    Doree Albee, PA-C 3:57 PM

## 2023-09-24 NOTE — Progress Notes (Signed)
Agree with the assessment and plan as outlined by Amanda Collier, PA-C. ? ?Corgan Mormile, DO, FACG ? ?

## 2023-09-28 ENCOUNTER — Other Ambulatory Visit: Payer: Self-pay

## 2023-09-28 ENCOUNTER — Ambulatory Visit: Payer: No Typology Code available for payment source | Admitting: Physical Therapy

## 2023-09-28 DIAGNOSIS — D509 Iron deficiency anemia, unspecified: Secondary | ICD-10-CM

## 2023-10-01 ENCOUNTER — Encounter

## 2023-10-05 ENCOUNTER — Telehealth: Payer: Self-pay | Admitting: Pharmacy Technician

## 2023-10-05 ENCOUNTER — Other Ambulatory Visit (HOSPITAL_COMMUNITY): Payer: Self-pay

## 2023-10-05 NOTE — Telephone Encounter (Signed)
Pharmacy Patient Advocate Encounter   Received notification from CoverMyMeds that prior authorization for LUBIPROSTONE is required/requested.   Insurance verification completed.   The patient is insured through [employer] .   Per test claim: CANCELLED due to PA not needed.

## 2023-10-06 ENCOUNTER — Other Ambulatory Visit: Payer: Self-pay

## 2023-10-06 DIAGNOSIS — D509 Iron deficiency anemia, unspecified: Secondary | ICD-10-CM

## 2023-10-06 NOTE — Telephone Encounter (Signed)
Pt stated that she changed her mind on the IV iron and would request to be set up for that. Pt was notified that I would place a referral to hematology and that they would reach out to the pt to schedule an appointment. Pt reminded of labs that need to be collected.  Pt verbalized understanding with all questions answered.

## 2023-10-06 NOTE — Telephone Encounter (Signed)
Inbound call from patient in regards to iron infusion. Patient also following up on prior aut. Please advise.

## 2023-10-07 ENCOUNTER — Other Ambulatory Visit (HOSPITAL_COMMUNITY): Payer: Self-pay

## 2023-10-07 NOTE — Telephone Encounter (Signed)
PA is not needed.

## 2023-10-20 ENCOUNTER — Inpatient Hospital Stay: Payer: No Typology Code available for payment source

## 2023-10-20 ENCOUNTER — Encounter: Payer: Self-pay | Admitting: Oncology

## 2023-10-20 ENCOUNTER — Inpatient Hospital Stay: Attending: Oncology | Admitting: Oncology

## 2023-10-20 VITALS — BP 144/60 | HR 71 | Resp 20 | Ht 64.57 in | Wt 230.0 lb

## 2023-10-20 DIAGNOSIS — Z87891 Personal history of nicotine dependence: Secondary | ICD-10-CM | POA: Insufficient documentation

## 2023-10-20 DIAGNOSIS — G8929 Other chronic pain: Secondary | ICD-10-CM | POA: Insufficient documentation

## 2023-10-20 DIAGNOSIS — M545 Low back pain, unspecified: Secondary | ICD-10-CM | POA: Diagnosis not present

## 2023-10-20 DIAGNOSIS — D500 Iron deficiency anemia secondary to blood loss (chronic): Secondary | ICD-10-CM | POA: Insufficient documentation

## 2023-10-20 DIAGNOSIS — Z860100 Personal history of colon polyps, unspecified: Secondary | ICD-10-CM | POA: Insufficient documentation

## 2023-10-20 DIAGNOSIS — F1990 Other psychoactive substance use, unspecified, uncomplicated: Secondary | ICD-10-CM | POA: Insufficient documentation

## 2023-10-20 DIAGNOSIS — K625 Hemorrhage of anus and rectum: Secondary | ICD-10-CM | POA: Insufficient documentation

## 2023-10-20 NOTE — Progress Notes (Signed)
[facility] Cancer Initial Visit:  Patient Care Team: Pcp, No as PCP - General  CHIEF COMPLAINTS/PURPOSE OF CONSULTATION:  HISTORY OF PRESENTING ILLNESS: Kristy Walker 62 y.o. female is here because of  anemia.  Medical history notable for obstructive sleep apnea, pulmonary nodules, COPD, GERD, bipolar disorder, obesity, hyperlipidemia, tobacco use  June 04 2021:  Colonoscopy-- One 4 mm polyp in the mid sigmoid colon, removed.  Entire examined colon is normal. Biopsied.  April 01 2023:  CBC notable for Hgb 9.7 MCV 77 Ferritin 5.5.  Patient as placed on oral iron and Vitamin C.  August 02, 2023: EGD--5 mm mucosal nodule in mid esophagus removed.  Lower third of esophagus mildly tortuous with well defined Z-line at 36 cm.  Localized minimal inflammation in gastric antrum.  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  Patient states that in approximately April of this year that she was diagnosed with anemia.  She remains on oral iron which she takes with vitamin C three times a week which she tolerates well.  Stopped taking ASA since diagnosed with anemia.  Takes Ibuprofen 600 mg bid for management of lumbar pain.  No hematemesis, melena but has passed blood clots via rectum.  Toilet bowl water is bright pink; when wipes has substantial blood clots.  The rectal bleeding began in June 2024.  She has numerous bowel movements daily.  Stools are soft, mushy, but does not pass mucus.  Patient is G0 P0.  No bleeding problems following tonsillectomy.  She did have altered mental status and elevated LFT's following the surgery requiring readmission.  No chest pain, SOB, DOE.  No history of blood transfusion  or IV iron.  No pica to ice or starch.    To be seen by Colorectal Surgery in December for hemorrhoidal banding  Social:  Married.  Formerly worked in Personal assistant in [employer]  Began smoking at 63 yrs of age.  Quit in June 2024.  Smoked up to 1.5 ppd.  EtOH social  [facility] Mother alive 75 obesity, knee replacement, colitis Father alive 83 CAD with MI Sister alive 56 colitis Sister alive 15 well   Review of Systems - Oncology  MEDICAL HISTORY: Past Medical History:  Diagnosis Date   Allergy    Anemia    Anxiety    Depression    Hyperlipidemia    Sleep apnea    cpap    SURGICAL HISTORY: Past Surgical History:  Procedure Laterality Date   COLONOSCOPY     TONSILLECTOMY     april 2016   UPPER GI ENDOSCOPY      SOCIAL HISTORY: Social History   Socioeconomic History   Marital status: Married    Spouse name: Not on file   Number of children: Not on file   Years of education: Not on file   Highest education level: Not on file  Occupational History   Not on file  Tobacco Use   Smoking status: Former    Types: Cigarettes   Smokeless tobacco: Never   Tobacco comments:    Last cigarette was May 17, 2023, using nicotine patches currently  Vaping Use   Vaping status: Never Used  Substance and Sexual Activity   Alcohol use: Yes    Comment: malted beverage once weekly   Drug use: Never   Sexual activity: Not on file  Other Topics Concern   Not on file  Social History Narrative   Not on file   Social Determinants of Health   Financial Resource Strain: Not on file  Food Insecurity: No Food Insecurity (10/20/2023)   Hunger Vital Sign    Worried About Running Out of Food in the Last Year: Never true    Ran Out of Food in the Last Year: Never true  Transportation Needs: No Transportation Needs (10/20/2023)   PRAPARE - Administrator, Civil Service (Medical): No    Lack of Transportation (Non-Medical): No  Physical Activity: Not on file  Stress: Not  on file  Social Connections: Unknown (04/19/2022)   [facility] from [facility], [facility]   Social Network    Social Network: Not on file  Intimate Partner Violence: Not At Risk (10/20/2023)   Humiliation, Afraid, Rape, and Kick questionnaire    Fear of Current or Ex-Partner: No    Emotionally Abused: No    Physically Abused: No    Sexually Abused: No    FAMILY HISTORY Family History  Problem Relation Age of Onset   Colitis Mother    Heart attack Father    Colitis Sister    Depression Sister    Colon cancer Neg Hx    Esophageal cancer Neg Hx    Rectal cancer Neg Hx    Stomach cancer Neg Hx     ALLERGIES:  is allergic to trileptal [oxcarbazepine].  MEDICATIONS:  Current Outpatient Medications  Medication Sig Dispense Refill   aspirin EC 81 MG tablet Take 81 mg by mouth daily.     buPROPion (WELLBUTRIN SR) 150 MG 12 hr tablet Take 150 mg by mouth 2 (two) times daily.     DULoxetine (CYMBALTA) 30 MG capsule Take 90 mg by mouth daily.     esomeprazole (NEXIUM) 20 MG capsule Take 20 mg by mouth daily at 12 noon.     ferrous sulfate 325 (65 FE) MG tablet Take 325 mg by mouth 3 (three) times a week.     HYDROcodone-acetaminophen (NORCO/VICODIN) 5-325 MG tablet Take 1 tablet by mouth every 6 (six) hours as needed for moderate pain.     ibuprofen (ADVIL) 200 MG tablet Take 400 mg by mouth 2 (two) times daily.     lamoTRIgine (LAMICTAL) 25 MG tablet Take 50 mg by mouth 2 (two) times daily.     magnesium oxide (MAG-OX) 400 (240 Mg) MG tablet Take 400 mg by mouth daily. Pt taking 420 mg     Multiple Vitamin (MULTIVITAMIN) tablet Take 1 tablet by mouth daily.     naloxone (NARCAN) nasal spray 4 mg/0.1 mL Place into the nose.     Omega-3 Fatty Acids (FISH-EPA PO) Take by mouth.     pravastatin (PRAVACHOL) 40 MG tablet TAKE ONE TABLET BY MOUTH AT BEDTIME FOR CHOLESTEROL     senna-docusate (SENOKOT-S) 8.6-50 MG tablet Take by mouth.     Tiotropium Bromide-Olodaterol 2.5-2.5 MCG/ACT  AERS Inhale 2 puffs into the lungs daily.     traZODone (DESYREL) 50 MG tablet Take 50-100 mg by mouth at bedtime.     VITAMIN D, CHOLECALCIFEROL, PO Take 2,000 Units by mouth daily.     hydrocortisone (ANUSOL-HC) 25 MG suppository Place 1 suppository (25 mg total) rectally 2 (two) times daily. For  7 days (Patient not taking: Reported on 10/20/2023) 14 suppository 2   lubiprostone (AMITIZA) 8 MCG capsule Take 1 capsule (8 mcg total) by mouth 2 (two) times daily with a meal. (Patient not taking: Reported on 10/20/2023) 60 capsule 3   No current facility-administered medications for this visit.    PHYSICAL EXAMINATION:  ECOG PERFORMANCE STATUS: 1 - Symptomatic but completely ambulatory   Vitals:   10/20/23 1520  BP: (!) 144/60  Pulse: 71  Resp: 20  SpO2: 94%    Filed Weights   10/20/23 1520  Weight: 230 lb (104.3 kg)     Physical Exam Vitals and nursing note reviewed.  Constitutional:      General: She is not in acute distress.    Appearance: Normal appearance. She is obese. She is not ill-appearing, toxic-appearing or diaphoretic.  HENT:     Head: Normocephalic and atraumatic.     Right Ear: External ear normal.     Left Ear: External ear normal.     Nose: Nose normal. No congestion or rhinorrhea.  Eyes:     General: No scleral icterus.    Extraocular Movements: Extraocular movements intact.     Conjunctiva/sclera: Conjunctivae normal.     Pupils: Pupils are equal, round, and reactive to light.  Cardiovascular:     Rate and Rhythm: Normal rate.     Heart sounds: No murmur heard.    No friction rub. No gallop.  Pulmonary:     Effort: Pulmonary effort is normal. No respiratory distress.     Breath sounds: Normal breath sounds.  Abdominal:     General: Bowel sounds are normal.     Palpations: Abdomen is soft.     Tenderness: There is no abdominal tenderness. There is no guarding or rebound.  Musculoskeletal:        General: No swelling, tenderness or deformity.      Cervical back: Normal range of motion and neck supple. No rigidity or tenderness.     Right lower leg: No edema.     Left lower leg: No edema.  Lymphadenopathy:     Head:     Right side of head: No submental, submandibular, tonsillar, preauricular, posterior auricular or occipital adenopathy.     Left side of head: No submental, submandibular, tonsillar, preauricular, posterior auricular or occipital adenopathy.     Cervical: No cervical adenopathy.     Right cervical: No superficial, deep or posterior cervical adenopathy.    Left cervical: No superficial, deep or posterior cervical adenopathy.     Upper Body:     Right upper body: No supraclavicular, axillary, pectoral or epitrochlear adenopathy.     Left upper body: No supraclavicular, axillary, pectoral or epitrochlear adenopathy.  Skin:    General: Skin is warm.     Coloration: Skin is not jaundiced.  Neurological:     General: No focal deficit present.     Mental Status: She is alert and oriented to person, place, and time.     Cranial Nerves: No cranial nerve deficit.  Psychiatric:        Mood and Affect: Mood normal.        Behavior: Behavior normal.        Thought Content: Thought content normal.        Judgment: Judgment normal.      LABORATORY DATA: I have personally reviewed the data as listed:  Appointment on 09/24/2023  Component Date Value Ref Range Status   WBC 09/24/2023 10.0  4.0 -  10.5 K/uL Final   RBC 09/24/2023 3.65 (L)  3.87 - 5.11 Mil/uL Final   Hemoglobin 09/24/2023 10.7 (L)  12.0 - 15.0 g/dL Final   HCT 45/40/9811 32.7 (L)  36.0 - 46.0 % Final   MCV 09/24/2023 89.7  78.0 - 100.0 fl Final   MCHC 09/24/2023 32.6  30.0 - 36.0 g/dL Final   RDW 91/47/8295 14.5  11.5 - 15.5 % Final   Platelets 09/24/2023 336.0  150.0 - 400.0 K/uL Final   Neutrophils Relative % 09/24/2023 64.0  43.0 - 77.0 % Final   Lymphocytes Relative 09/24/2023 26.5  12.0 - 46.0 % Final   Monocytes Relative 09/24/2023 6.7  3.0 - 12.0 %  Final   Eosinophils Relative 09/24/2023 1.8  0.0 - 5.0 % Final   Basophils Relative 09/24/2023 1.0  0.0 - 3.0 % Final   Neutro Abs 09/24/2023 6.4  1.4 - 7.7 K/uL Final   Lymphs Abs 09/24/2023 2.6  0.7 - 4.0 K/uL Final   Monocytes Absolute 09/24/2023 0.7  0.1 - 1.0 K/uL Final   Eosinophils Absolute 09/24/2023 0.2  0.0 - 0.7 K/uL Final   Basophils Absolute 09/24/2023 0.1  0.0 - 0.1 K/uL Final   Sodium 09/24/2023 138  135 - 145 mEq/L Final   Potassium 09/24/2023 4.2  3.5 - 5.1 mEq/L Final   Chloride 09/24/2023 102  96 - 112 mEq/L Final   CO2 09/24/2023 29  19 - 32 mEq/L Final   Glucose, Bld 09/24/2023 108 (H)  70 - 99 mg/dL Final   BUN 62/13/0865 9  6 - 23 mg/dL Final   Creatinine, Ser 09/24/2023 0.85  0.40 - 1.20 mg/dL Final   Total Bilirubin 09/24/2023 0.2  0.2 - 1.2 mg/dL Final   Alkaline Phosphatase 09/24/2023 98  39 - 117 U/L Final   AST 09/24/2023 16  0 - 37 U/L Final   ALT 09/24/2023 22  0 - 35 U/L Final   Total Protein 09/24/2023 7.0  6.0 - 8.3 g/dL Final   Albumin 78/46/9629 4.2  3.5 - 5.2 g/dL Final   GFR 52/84/1324 72.90  >60.00 mL/min Final   Calculated using the CKD-EPI Creatinine Equation (2021)   Calcium 09/24/2023 9.6  8.4 - 10.5 mg/dL Final   Iron 40/09/2724 36 (L)  42 - 145 ug/dL Final   Transferrin 36/64/4034 298.0  212.0 - 360.0 mg/dL Final   Saturation Ratios 09/24/2023 8.6 (L)  20.0 - 50.0 % Final   Ferritin 09/24/2023 22.6  10.0 - 291.0 ng/mL Final   TIBC 09/24/2023 417.2  250.0 - 450.0 mcg/dL Final    RADIOGRAPHIC STUDIES: I have personally reviewed the radiological images as listed and agree with the findings in the report  No results found.  ASSESSMENT/PLAN  62 y.o. female is here because of  anemia.  Medical history notable for obstructive sleep apnea, pulmonary nodules, COPD, GERD, bipolar disorder, obesity, hyperlipidemia, tobacco use   Anemia- Iron deficiency due two probable causes 1) UGI blood loss related to excessive NSAID use for back pain 2)  rectal bleeding  June 04 2021:  Colonoscopy-- One 4 mm polyp in the mid sigmoid colon, removed.  Entire examined colon is normal. Biopsied.   April 01 2023:  CBC notable for Hgb 9.7 MCV 77 Ferritin 5.5.  Patient as placed on oral iron and Vitamin C. August 02, 2023: EGD--5 mm mucosal nodule in mid esophagus removed.  Lower third of esophagus mildly tortuous with well defined Z-line at 36 cm.  Localized minimal inflammation in  gastric antrum.  Biopsies taken to evaluate for celiac disease    September 24, 2023: WBC 10.0 hemoglobin 10.7 MCV 90 platelet count 336; 64 segs 27 lymphs 7 monos 2 eos 1 basophil Ferritin 23  [employer] October 20 2023- Since patient is asymptomatic with anemia and has responded to oral iron this should be continued.  Recommended that she decrease use of NSAIDS and seek other measures to control back pain.  Agree with evaluation by colorectal surgery for hemorrhoidal banding at which time they will also perform lower endoscopy.    Follow up in 2 months to reassess matters.  Patient in agreement with the plan    Cancer Staging  No matching staging information was found for the patient.    No problem-specific Assessment & Plan notes found for this encounter.    No orders of the defined types were placed in this encounter.   60  minutes was spent in patient care.  This included time spent preparing to see the patient (e.g., review of tests), obtaining and/or reviewing separately obtained history, counseling and educating the patient/family/caregiver, ordering medications, tests, or procedures; documenting clinical information in the electronic or other health record, independently interpreting results and communicating results to the patient/family/caregiver as well as coordination of care.       All questions were answered. The patient knows to call the clinic with any problems, questions or concerns.  This note was electronically signed.    Loni Muse,  MD  10/20/2023 3:57 PM

## 2023-11-18 ENCOUNTER — Ambulatory Visit: Admitting: Gastroenterology

## 2023-11-22 ENCOUNTER — Telehealth: Payer: Self-pay | Admitting: Physician Assistant

## 2023-11-22 NOTE — Telephone Encounter (Signed)
Discussed with pt that there is no required recovery time for hem banding. She wanted to know if she needed a doughnut pillow, let her know she should not need that as it is usually with surgery.

## 2023-11-22 NOTE — Telephone Encounter (Signed)
Inbound call from patient wishing to speak about 12/19 banding appointment further. Requesting to discuss recovery time. Please advise, thank you.

## 2023-11-25 ENCOUNTER — Ambulatory Visit: Payer: No Typology Code available for payment source | Admitting: Gastroenterology

## 2023-11-25 ENCOUNTER — Encounter: Payer: Self-pay | Admitting: Gastroenterology

## 2023-11-25 VITALS — BP 134/70 | HR 79 | Ht 65.0 in | Wt 224.0 lb

## 2023-11-25 DIAGNOSIS — K589 Irritable bowel syndrome without diarrhea: Secondary | ICD-10-CM

## 2023-11-25 DIAGNOSIS — K921 Melena: Secondary | ICD-10-CM

## 2023-11-25 DIAGNOSIS — K641 Second degree hemorrhoids: Secondary | ICD-10-CM

## 2023-11-25 DIAGNOSIS — K649 Unspecified hemorrhoids: Secondary | ICD-10-CM

## 2023-11-25 DIAGNOSIS — K5904 Chronic idiopathic constipation: Secondary | ICD-10-CM

## 2023-11-25 NOTE — Patient Instructions (Signed)
_______________________________________________________  If your blood pressure at your visit was 140/90 or greater, please contact your primary care physician to follow up on this.  If you are age 63 or younger, your body mass index should be between 19-25. Your Body mass index is 37.28 kg/m. If this is out of the aformentioned range listed, please consider follow up with your Primary Care Provider.  ________________________________________________________  The [facility] providers would like to encourage you to use [facility] to communicate with providers for non-urgent requests or questions.  Due to long hold times on the telephone, sending your provider a message by [facility] may be a faster and more efficient way to get a response.  Please allow 48 business hours for a response.  Please remember that this is for non-urgent requests.  _______________________________________________________  [facility] PROCEDURE    FOLLOW-UP CARE   The procedure you have had should have been relatively painless since the banding of the area involved does not have nerve endings and there is no pain sensation.  The rubber band cuts off the blood supply to the hemorrhoid and the band may fall off as soon as 48 hours after the banding (the band may occasionally be seen in the toilet bowl following a bowel movement). You may notice a temporary feeling of fullness in the rectum which should respond adequately to plain Tylenol or Motrin.  Following the banding, avoid strenuous exercise that evening and resume full activity the next day.  A sitz bath (soaking in a warm tub) or bidet is soothing, and can be useful for cleansing the area after bowel movements.     To avoid constipation, take two tablespoons of natural wheat bran, natural oat bran, flax, Benefiber or any over the counter fiber supplement and increase your water intake to 7-8 glasses daily.    Unless you have been prescribed anorectal medication,  do not put anything inside your rectum for two weeks: No suppositories, enemas, fingers, etc.  Occasionally, you may have more bleeding than usual after the banding procedure.  This is often from the untreated hemorrhoids rather than the treated one.  Don't be concerned if there is a tablespoon or so of blood.  If there is more blood than this, lie flat with your bottom higher than your head and apply an ice pack to the area. If the bleeding does not stop within a half an hour or if you feel faint, call our office at [phone] or go to the emergency room.  Problems are not common; however, if there is a substantial amount of bleeding, severe pain, chills, fever or difficulty passing urine (very rare) or other problems, you should call us at [phone] or report to the nearest emergency room.  Do not stay seated continuously for more than 2-3 hours for a day or two after the procedure.  Tighten your buttock muscles 10-15 times every two hours and take 10-15 deep breaths every 1-2 hours.  Do not spend more than a few minutes on the toilet if you cannot empty your bowel; instead re-visit the toilet at a later time.   It was a pleasure to see you today!  Vito Cirigliano, D.O.

## 2023-11-25 NOTE — Progress Notes (Signed)
Chief Complaint:    Symptomatic Internal Hemorrhoids; Hemorrhoid Band Ligation  GI History: 62 y.o. 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  05/2021 colonoscopy with 4 mm hyperplastic polyp sigmoid, negative microscopic colitis biopsies, sigmoid diverticulosis, grade 2 internal hemorrhoids recall 10 years 07/29/2023 office visit with Dr. Chales Abrahams for constipation abdominal pain and anemia continued on Nexium 20 mg daily added magnesium and MiraLAX with Anusol suppositories 08/02/2023 EGD Dr. 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 09/24/2023 follow-up in the GI clinic with Quentin Mulling.  Anoscopy with large grade 2-3 hemorrhoids with inflammation  HPI:     Patient is a 62 y.o. female with a history of symptomatic internal hemorrhoids, referred to me by Dr. Chales Abrahams and Quentin Mulling for evaluation and possible treatment of symptomatic hemorrhoids complicated by anemia from recurrent bleeding. The patient presents with symptomatic grade 2  hemorrhoids, unresponsive to maximal medical therapy, requesting rubber band ligation of symptomatic hemorrhoidal disease.   She was last seen by Quentin Mulling on 09/24/2023.  For her constipation, started on Amitiza (had diarrhea with previous trial of Linzess) and suppositories as needed.  Was seen in the Hematology Clinic on 10/20/2023 for follow-up of IDA with good response to oral iron and plan to decrease NSAID use.  Otherwise, no change in medical or surgical history, medications, allergies, social history since last appointment with Quentin Mulling on 09/24/2023.   Review of systems:     No chest pain, no SOB, no fevers, no urinary sx   Past Medical History:  Diagnosis Date   Allergy    Anemia    Anxiety    Depression    Hyperlipidemia    Sleep apnea    cpap    Patient's surgical history, family medical history, social history, medications and allergies were all reviewed in Epic    Current Outpatient Medications  Medication Sig Dispense Refill   aspirin EC 81 MG tablet Take 81 mg by mouth daily.     buPROPion (WELLBUTRIN SR) 150 MG 12 hr tablet Take 150 mg by mouth 2 (two) times daily.     DULoxetine (CYMBALTA) 30 MG capsule Take 90 mg by mouth daily.     esomeprazole (NEXIUM) 20 MG capsule Take 20 mg by mouth daily at 12 noon.     ferrous sulfate 325 (65 FE) MG tablet Take 325 mg by mouth 3 (three) times a week.     HYDROcodone-acetaminophen (NORCO/VICODIN) 5-325 MG tablet Take 1 tablet by mouth every 6 (six) hours as needed for moderate pain.     hydrocortisone (ANUSOL-HC) 25 MG suppository Place 1 suppository (25 mg total) rectally 2 (two) times daily. For 7 days (Patient not taking: Reported on 10/20/2023) 14 suppository 2   ibuprofen (ADVIL) 200 MG tablet Take 400 mg by mouth 2 (two) times daily.     lamoTRIgine (LAMICTAL) 25 MG tablet Take 50 mg by mouth 2 (two) times daily.     lubiprostone (AMITIZA) 8 MCG capsule Take 1 capsule (8 mcg total) by mouth 2 (two) times daily with a meal. (Patient not taking: Reported on 10/20/2023) 60 capsule 3   magnesium oxide (MAG-OX) 400 (240 Mg) MG tablet  Take 400 mg by mouth daily. Pt taking 420 mg     Multiple Vitamin (MULTIVITAMIN) tablet Take 1 tablet by mouth daily.     naloxone (NARCAN) nasal spray 4 mg/0.1 mL Place into the nose.     Omega-3 Fatty Acids (FISH-EPA PO) Take by mouth.     pravastatin (PRAVACHOL) 40 MG tablet TAKE ONE TABLET BY MOUTH AT BEDTIME FOR CHOLESTEROL     senna-docusate (SENOKOT-S) 8.6-50 MG tablet Take by mouth.     Tiotropium Bromide-Olodaterol  2.5-2.5 MCG/ACT AERS Inhale 2 puffs into the lungs daily.     traZODone (DESYREL) 50 MG tablet Take 50-100 mg by mouth at bedtime.     VITAMIN D, CHOLECALCIFEROL, PO Take 2,000 Units by mouth daily.     No current facility-administered medications for this visit.    Physical Exam:     There were no vitals taken for this visit.  GENERAL:  Pleasant female in NAD PSYCH: : Cooperative, normal affect NEURO: Alert and oriented x 3, no focal neurologic deficits Rectal exam: Sensation intact and preserved anal wink.  External skin tags.  Grade 2 hemorrhoids noted in all positions on exam.  No external anal fissures noted. Normal sphincter tone. No palpable mass. No blood on the exam glove. (Chaperone: Thompson Grayer, CMA).   IMPRESSION and PLAN:    #1.  Symptomatic internal hemorrhoids: PROCEDURE NOTE: The patient presents with symptomatic grade 2 hemorrhoids, unresponsive to maximal medical therapy, requesting rubber band ligation of symptomatic hemorrhoidal disease.  All risks, benefits and alternative forms of therapy were described and informed consent was obtained.  In the Left Lateral Decubitus position, anoscopic examination revealed grade 2 hemorrhoids in the all position(s).  The anorectum was pre-medicated with RectiCare. The decision was made to band the LL internal hemorrhoid, and the [facility] System was used to perform band ligation without complication.  Digital anorectal examination was then performed to assure proper positioning of the band, and to adjust the banded tissue as required.  The patient was discharged home without pain or other issues.  Dietary and behavioral recommendations were given and along with follow-up instructions.     The following adjunctive treatments were recommended:  -Resume high-fiber diet with fiber supplement (i.e. Citrucel or Benefiber) with goal for soft stools without straining to have a BM. -Resume adequate fluid intake.  The patient will  return in 4+ weeks for follow-up and possible additional banding as required. No complications were encountered and the patient tolerated the procedure well.      #2.  IBS - Continue current medication regimen - Will follow-up with Dr. Chales Abrahams for ongoing care      Shellia Cleverly ,DO, [facility] 11/25/2023, 11:11 AM

## 2023-12-06 ENCOUNTER — Other Ambulatory Visit (HOSPITAL_COMMUNITY): Payer: Self-pay

## 2023-12-09 ENCOUNTER — Telehealth: Payer: Self-pay

## 2023-12-09 ENCOUNTER — Other Ambulatory Visit (HOSPITAL_COMMUNITY): Payer: Self-pay

## 2023-12-09 NOTE — Telephone Encounter (Signed)
 Pharmacy Patient Advocate Encounter   Received notification from CoverMyMeds that prior authorization for Lubiprostone  capsules is required/requested.   Insurance verification completed.   The patient is insured through [employer] .   Prior Authorization for Lubiprostone  capsules has been APPROVED from 12-09-2023 to 12-07-2024. Ran test claim, Copay is $16.00 for a quantity of 60 capsules per 30 days. This test claim was processed through [facility]- copay amounts may vary at other pharmacies due to pharmacy/plan contracts, or as the patient moves through the different stages of their insurance plan.   PA #/Case ID/Reference #: [id number]

## 2023-12-17 ENCOUNTER — Telehealth: Payer: Self-pay | Admitting: Physician Assistant

## 2023-12-17 NOTE — Telephone Encounter (Signed)
 PT was prescribed lubiprostone and needed a PA but it was never approved so she is calling to find out an alternative for the medication. Please advise.

## 2023-12-20 ENCOUNTER — Inpatient Hospital Stay: Attending: Oncology | Admitting: Oncology

## 2023-12-20 VITALS — BP 152/76 | HR 80 | Temp 98.4°F | Resp 16 | Ht 65.0 in | Wt 223.7 lb

## 2023-12-20 DIAGNOSIS — D500 Iron deficiency anemia secondary to blood loss (chronic): Secondary | ICD-10-CM | POA: Diagnosis present

## 2023-12-20 DIAGNOSIS — Z87891 Personal history of nicotine dependence: Secondary | ICD-10-CM | POA: Insufficient documentation

## 2023-12-20 DIAGNOSIS — K922 Gastrointestinal hemorrhage, unspecified: Secondary | ICD-10-CM | POA: Diagnosis not present

## 2023-12-20 DIAGNOSIS — Z860100 Personal history of colon polyps, unspecified: Secondary | ICD-10-CM | POA: Diagnosis not present

## 2023-12-20 DIAGNOSIS — K625 Hemorrhage of anus and rectum: Secondary | ICD-10-CM | POA: Diagnosis not present

## 2023-12-20 NOTE — Telephone Encounter (Signed)
 Sent patient MyChart message to discuss Amitiza .  I do believe this is more of a long-term medication to help her with what appears to be long-term constipation with resulting hemorrhoids.  I reached out see if she would like to continue the medication or if it is too expensive or if we need to try something different.  She has a follow-up appointment with myself in February and a hemorrhoidal banding appointment with Dr. San in March.  Continue with these appointments.

## 2023-12-20 NOTE — Progress Notes (Signed)
 [facility] Cancer Follow up Visit:  Patient Care Team: Pcp, No as PCP - General  CHIEF COMPLAINTS/PURPOSE OF CONSULTATION:  HISTORY OF PRESENTING ILLNESS: Kristy Walker 63 y.o. female is here because of  anemia.  Medical history notable for obstructive sleep apnea, pulmonary nodules, COPD, GERD, bipolar disorder, obesity, hyperlipidemia, tobacco use  June 04 2021:  Colonoscopy-- One 4 mm polyp in the mid sigmoid colon, removed.  Entire examined colon is normal. Biopsied.  April 01 2023:  CBC notable for Hgb 9.7 MCV 77 Ferritin 5.5.  Patient as placed on oral iron and Vitamin C.  August 02, 2023: EGD--5 mm mucosal nodule in mid esophagus removed.  Lower third of esophagus mildly tortuous with well defined Z-line at 36 cm.  Localized minimal inflammation in gastric antrum.  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  Patient states that in approximately April of this year that she was diagnosed with anemia.  She remains on oral iron which she takes with vitamin C three times a week which she tolerates well.  Stopped taking ASA since diagnosed with anemia.  Takes Ibuprofen 600 mg bid for management of lumbar pain.  No hematemesis, melena but has passed blood clots via rectum.  Toilet bowl water is bright pink; when wipes has substantial blood clots.  The rectal bleeding began in June 2024.  She has numerous bowel movements daily.  Stools are soft, mushy, but does not pass mucus.  Patient is G0 P0.  No bleeding problems following tonsillectomy.  She did have altered mental status and elevated LFT's following the surgery requiring readmission.  No chest pain, SOB, DOE.  No history of blood transfusion  or IV iron.  No pica to ice or starch.    To be seen by Colorectal Surgery in December for hemorrhoidal banding  Social:  Married.  Formerly worked in personal assistant in [employer]  Began smoking at 21 yrs of age.  Quit in June 2024.  Smoked up to 1.5 ppd.  EtOH social  [facility] Mother alive 54 obesity, knee replacement, colitis Father alive 49 CAD with MI Sister alive 69 colitis Sister alive 48 well  November 25 2023:  Banding of internal hemorrhoid.    December 16 2023:  Follow up at [facility] WBC 9.9 hemoglobin 12.6 MCV 89 platelet count 302 ferritin 20.1  December 20, 2023: Scheduled follow-up for anemia.  Reviewed recent history and labs with patient.  Feels well.  Tolerating oral iron.  Scant amount of rectal bleeding without passage of clots since hemorrhoids were banded  March 2025: Second banding for internal hemorrhoids    Review of Systems - Oncology  MEDICAL HISTORY: Past Medical History:  Diagnosis Date   Allergy    Anemia    Anxiety    Depression    Hyperlipidemia    Sleep apnea    cpap    SURGICAL HISTORY: Past Surgical History:  Procedure Laterality Date   COLONOSCOPY     TONSILLECTOMY     april 2016   UPPER GI ENDOSCOPY      SOCIAL HISTORY: Social History   Socioeconomic History   Marital status: Married    Spouse name: Not on  file   Number of children: Not on file   Years of education: Not on file   Highest education level: Not on file  Occupational History   Not on file  Tobacco Use   Smoking status: Former    Types: Cigarettes   Smokeless tobacco: Never   Tobacco comments:    Last cigarette was May 17, 2023, using nicotine patches currently  Vaping Use   Vaping status: Never Used  Substance and Sexual Activity   Alcohol use: Yes    Comment: malted beverage once weekly   Drug use: Never   Sexual activity: Not on file  Other Topics Concern   Not on file  Social History Narrative   Not on file   Social Drivers of Health   Financial  Resource Strain: Not on file  Food Insecurity: No Food Insecurity (10/20/2023)   Hunger Vital Sign    Worried About Running Out of Food in the Last Year: Never true    Ran Out of Food in the Last Year: Never true  Transportation Needs: No Transportation Needs (10/20/2023)   PRAPARE - Administrator, Civil Service (Medical): No    Lack of Transportation (Non-Medical): No  Physical Activity: Not on file  Stress: Not on file  Social Connections: Unknown (04/19/2022)   Received from [facility]   Social Network    Social Network: Not on file  Intimate Partner Violence: Not At Risk (10/20/2023)   Humiliation, Afraid, Rape, and Kick questionnaire    Fear of Current or Ex-Partner: No    Emotionally Abused: No    Physically Abused: No    Sexually Abused: No    FAMILY HISTORY Family History  Problem Relation Age of Onset   Colitis Mother    Stroke Mother    Heart attack Father    Colitis Sister    Depression Sister    Colon cancer Neg Hx    Esophageal cancer Neg Hx    Rectal cancer Neg Hx    Stomach cancer Neg Hx     ALLERGIES:  is allergic to trileptal [oxcarbazepine].  MEDICATIONS:  Current Outpatient Medications  Medication Sig Dispense Refill   aspirin EC 81 MG tablet Take 81 mg by mouth daily.     buPROPion (WELLBUTRIN SR) 150 MG 12 hr tablet Take 150 mg by mouth 2 (two) times daily.     DULoxetine (CYMBALTA) 30 MG capsule Take 90 mg by mouth daily.     esomeprazole (NEXIUM) 20 MG capsule Take 20 mg by mouth daily at 12 noon.     ferrous sulfate 325 (65 FE) MG tablet Take 325 mg by mouth 3 (three) times a week.     HYDROcodone-acetaminophen (NORCO/VICODIN) 5-325 MG tablet Take 1 tablet by mouth every 6 (six) hours as needed for moderate pain.     hydrocortisone  (ANUSOL -HC) 25 MG suppository Place 1 suppository (25 mg total) rectally 2 (two) times daily. For 7 days (Patient not taking: Reported on 11/25/2023) 14 suppository 2   ibuprofen (ADVIL)  200 MG tablet Take 400 mg by mouth 2 (two) times daily.     lamoTRIgine (LAMICTAL) 25 MG tablet Take 75 mg by mouth 2 (two) times daily.     lubiprostone  (AMITIZA ) 8 MCG capsule Take 1 capsule (8 mcg total) by mouth 2 (two) times daily with a meal. 60 capsule 3   magnesium oxide (MAG-OX) 400 (240 Mg) MG tablet Take 400 mg by mouth daily. Pt taking 420 mg  Multiple Vitamin (MULTIVITAMIN) tablet Take 1 tablet by mouth daily.     naloxone (NARCAN) nasal spray 4 mg/0.1 mL Place into the nose.     Omega-3 Fatty Acids (FISH-EPA PO) Take by mouth.     pravastatin (PRAVACHOL) 40 MG tablet TAKE ONE TABLET BY MOUTH AT BEDTIME FOR CHOLESTEROL     senna-docusate (SENOKOT-S) 8.6-50 MG tablet Take by mouth.     Tiotropium Bromide-Olodaterol 2.5-2.5 MCG/ACT AERS Inhale 2 puffs into the lungs daily.     traZODone (DESYREL) 50 MG tablet Take 50-100 mg by mouth at bedtime.     VITAMIN D, CHOLECALCIFEROL, PO Take 2,000 Units by mouth daily.     No current facility-administered medications for this visit.    PHYSICAL EXAMINATION:  ECOG PERFORMANCE STATUS: 1 - Symptomatic but completely ambulatory   There were no vitals filed for this visit.   There were no vitals filed for this visit.    Physical Exam Vitals and nursing note reviewed.  Constitutional:      General: She is not in acute distress.    Appearance: Normal appearance. She is obese. She is not ill-appearing, toxic-appearing or diaphoretic.  HENT:     Head: Normocephalic and atraumatic.     Right Ear: External ear normal.     Left Ear: External ear normal.     Nose: Nose normal. No congestion or rhinorrhea.  Eyes:     General: No scleral icterus.    Extraocular Movements: Extraocular movements intact.     Conjunctiva/sclera: Conjunctivae normal.     Pupils: Pupils are equal, round, and reactive to light.  Cardiovascular:     Rate and Rhythm: Normal rate.     Heart sounds: No murmur heard.    No friction rub. No gallop.   Pulmonary:     Effort: Pulmonary effort is normal. No respiratory distress.     Breath sounds: Normal breath sounds.  Abdominal:     General: Bowel sounds are normal.     Palpations: Abdomen is soft.     Tenderness: There is no abdominal tenderness. There is no guarding or rebound.  Musculoskeletal:        General: No swelling, tenderness or deformity.     Cervical back: Normal range of motion and neck supple. No rigidity or tenderness.     Right lower leg: No edema.     Left lower leg: No edema.  Lymphadenopathy:     Head:     Right side of head: No submental, submandibular, tonsillar, preauricular, posterior auricular or occipital adenopathy.     Left side of head: No submental, submandibular, tonsillar, preauricular, posterior auricular or occipital adenopathy.     Cervical: No cervical adenopathy.     Right cervical: No superficial, deep or posterior cervical adenopathy.    Left cervical: No superficial, deep or posterior cervical adenopathy.     Upper Body:     Right upper body: No supraclavicular, axillary, pectoral or epitrochlear adenopathy.     Left upper body: No supraclavicular, axillary, pectoral or epitrochlear adenopathy.  Skin:    General: Skin is warm.     Coloration: Skin is not jaundiced.  Neurological:     General: No focal deficit present.     Mental Status: She is alert and oriented to person, place, and time.     Cranial Nerves: No cranial nerve deficit.  Psychiatric:        Mood and Affect: Mood normal.        Behavior:  Behavior normal.        Thought Content: Thought content normal.        Judgment: Judgment normal.      LABORATORY DATA: I have personally reviewed the data as listed:  No visits with results within 1 Month(s) from this visit.  Latest known visit with results is:  Appointment on 09/24/2023  Component Date Value Ref Range Status   WBC 09/24/2023 10.0  4.0 - 10.5 K/uL Final   RBC 09/24/2023 3.65 (L)  3.87 - 5.11 Mil/uL Final    Hemoglobin 09/24/2023 10.7 (L)  12.0 - 15.0 g/dL Final   HCT 89/81/7975 32.7 (L)  36.0 - 46.0 % Final   MCV 09/24/2023 89.7  78.0 - 100.0 fl Final   MCHC 09/24/2023 32.6  30.0 - 36.0 g/dL Final   RDW 89/81/7975 14.5  11.5 - 15.5 % Final   Platelets 09/24/2023 336.0  150.0 - 400.0 K/uL Final   Neutrophils Relative % 09/24/2023 64.0  43.0 - 77.0 % Final   Lymphocytes Relative 09/24/2023 26.5  12.0 - 46.0 % Final   Monocytes Relative 09/24/2023 6.7  3.0 - 12.0 % Final   Eosinophils Relative 09/24/2023 1.8  0.0 - 5.0 % Final   Basophils Relative 09/24/2023 1.0  0.0 - 3.0 % Final   Neutro Abs 09/24/2023 6.4  1.4 - 7.7 K/uL Final   Lymphs Abs 09/24/2023 2.6  0.7 - 4.0 K/uL Final   Monocytes Absolute 09/24/2023 0.7  0.1 - 1.0 K/uL Final   Eosinophils Absolute 09/24/2023 0.2  0.0 - 0.7 K/uL Final   Basophils Absolute 09/24/2023 0.1  0.0 - 0.1 K/uL Final   Sodium 09/24/2023 138  135 - 145 mEq/L Final   Potassium 09/24/2023 4.2  3.5 - 5.1 mEq/L Final   Chloride 09/24/2023 102  96 - 112 mEq/L Final   CO2 09/24/2023 29  19 - 32 mEq/L Final   Glucose, Bld 09/24/2023 108 (H)  70 - 99 mg/dL Final   BUN 89/81/7975 9  6 - 23 mg/dL Final   Creatinine, Ser 09/24/2023 0.85  0.40 - 1.20 mg/dL Final   Total Bilirubin 09/24/2023 0.2  0.2 - 1.2 mg/dL Final   Alkaline Phosphatase 09/24/2023 98  39 - 117 U/L Final   AST 09/24/2023 16  0 - 37 U/L Final   ALT 09/24/2023 22  0 - 35 U/L Final   Total Protein 09/24/2023 7.0  6.0 - 8.3 g/dL Final   Albumin 89/81/7975 4.2  3.5 - 5.2 g/dL Final   GFR 89/81/7975 72.90  >60.00 mL/min Final   Calculated using the CKD-EPI Creatinine Equation (2021)   Calcium 09/24/2023 9.6  8.4 - 10.5 mg/dL Final   Iron 89/81/7975 36 (L)  42 - 145 ug/dL Final   Transferrin 89/81/7975 298.0  212.0 - 360.0 mg/dL Final   Saturation Ratios 09/24/2023 8.6 (L)  20.0 - 50.0 % Final   Ferritin 09/24/2023 22.6  10.0 - 291.0 ng/mL Final   TIBC 09/24/2023 417.2  250.0 - 450.0 mcg/dL Final     RADIOGRAPHIC STUDIES: I have personally reviewed the radiological images as listed and agree with the findings in the report  No results found.  ASSESSMENT/PLAN  63 y.o. female is here because of  anemia.  Medical history notable for obstructive sleep apnea, pulmonary nodules, COPD, GERD, bipolar disorder, obesity, hyperlipidemia, tobacco use   Anemia- Iron deficiency due two probable causes 1) UGI blood loss related to excessive NSAID use for back pain 2) rectal bleeding  June 04 2021:  Colonoscopy-- One 4 mm polyp in the mid sigmoid colon, removed.  Entire examined colon is normal. Biopsied.   April 01 2023:  CBC notable for Hgb 9.7 MCV 77 Ferritin 5.5.  Patient as placed on oral iron and Vitamin C. August 02, 2023: EGD--5 mm mucosal nodule in mid esophagus removed.  Lower third of esophagus mildly tortuous with well defined Z-line at 36 cm.  Localized minimal inflammation in gastric antrum.  Biopsies taken to evaluate for celiac disease    September 24, 2023: WBC 10.0 hemoglobin 10.7 MCV 90 platelet count 336; 64 segs 27 lymphs 7 monos 2 eos 1 basophil Ferritin 23  [employer] October 20 2023- Since patient is asymptomatic with anemia and has responded to oral iron this should be continued.  Recommended that she decrease use of NSAIDS and seek other measures to control back pain.  Agree with evaluation by colorectal surgery for hemorrhoidal banding at which time they will also perform lower endoscopy.    Follow up in 2 months to reassess matters.  Patient in agreement with the plan    November 25 2023:  Banding of internal hemorrhoid.    December 16 2023:  Hgb 12.6 MCV 89 ferritin 20.1 December 20 2023- Rectal bleeding improved with hemorrhoidal banding.  Tolerating oral iron well which she should continue until iron stores are repleted.  Follow up as needed.  Patient in agreement with the plan   Cancer Staging  No matching staging information was found for the patient.    No  problem-specific Assessment & Plan notes found for this encounter.    No orders of the defined types were placed in this encounter.   20  minutes was spent in patient care.  This included time spent preparing to see the patient (e.g., review of tests), obtaining and/or reviewing separately obtained history, counseling and educating the patient/family/caregiver, ordering medications, tests, or procedures; documenting clinical information in the electronic or other health record, independently interpreting results and communicating results to the patient/family/caregiver as well as coordination of care.       All questions were answered. The patient knows to call the clinic with any problems, questions or concerns.  This note was electronically signed.    Guillermina JAYSON Perla, MD  12/20/2023 8:39 AM

## 2024-01-26 ENCOUNTER — Ambulatory Visit: Payer: No Typology Code available for payment source | Admitting: Physician Assistant

## 2024-02-09 ENCOUNTER — Ambulatory Visit: Payer: No Typology Code available for payment source | Admitting: Gastroenterology

## 2024-02-09 ENCOUNTER — Encounter: Payer: Self-pay | Admitting: Gastroenterology

## 2024-02-09 VITALS — BP 128/70 | HR 70 | Ht 65.0 in | Wt 224.0 lb

## 2024-02-09 DIAGNOSIS — K649 Unspecified hemorrhoids: Secondary | ICD-10-CM

## 2024-02-09 DIAGNOSIS — K641 Second degree hemorrhoids: Secondary | ICD-10-CM | POA: Diagnosis not present

## 2024-02-09 DIAGNOSIS — K581 Irritable bowel syndrome with constipation: Secondary | ICD-10-CM

## 2024-02-09 NOTE — Progress Notes (Signed)
 Chief Complaint:    Symptomatic Internal Walker; Hemorrhoid Band Ligation  GI History: 63 y.o. 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  05/2021 colonoscopy with 4 mm hyperplastic polyp sigmoid, negative microscopic colitis biopsies, sigmoid diverticulosis, grade 2 internal Walker recall 10 years 07/29/2023 office visit with Dr. Chales Abrahams for constipation abdominal pain and anemia continued on Nexium 20 mg daily added magnesium and MiraLAX with Anusol suppositories 08/02/2023 EGD Dr. 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 09/24/2023 follow-up in the GI clinic with Quentin Mulling.  Anoscopy with large grade 2-3 Walker with inflammation 11/25/2023 banding of LL hemorrhoid  HPI:     Patient is a Kristy Walker presenting to the Gastroenterology Clinic for follow-up and ongoing treatment. The patient presents with symptomatic grade 2 Walker, unresponsive to maximal medical therapy, requesting rubber band ligation of symptomatic hemorrhoidal disease.  No change in medical or surgical history, medications, allergies, social history since last appointment with me.   Review of systems:     No chest pain, no SOB, no fevers, no urinary sx   Past Medical History:  Diagnosis Date   Allergy    Anemia    Anxiety    Depression    Hyperlipidemia    Sleep apnea    cpap    Patient's surgical history, family medical history, social history, medications and allergies were all reviewed in Epic    Current Outpatient  Medications  Medication Sig Dispense Refill   carvedilol (COREG) 12.5 MG tablet Take 6.25 mg by mouth 2 (two) times daily.     aspirin EC 81 MG tablet Take 81 mg by mouth daily.     DULoxetine (CYMBALTA) 30 MG capsule Take 90 mg by mouth daily.     esomeprazole (NEXIUM) 20 MG capsule Take 20 mg by mouth daily at 12 noon.     ferrous sulfate 325 (65 FE) MG tablet Take 325 mg by mouth 3 (three) times a week.     HYDROcodone-acetaminophen (NORCO/VICODIN) 5-325 MG tablet Take 1 tablet by mouth every 6 (six) hours as needed for moderate pain.     ibuprofen (ADVIL) 200 MG tablet Take 400 mg by mouth 2 (two) times daily.     lamoTRIgine (LAMICTAL) 25 MG tablet Take 75 mg by mouth 2 (two) times daily.     magnesium oxide (MAG-OX) 400 (240 Mg) MG tablet Take 400 mg by mouth daily. Pt taking 420 mg     Multiple Vitamin (MULTIVITAMIN) tablet Take 1 tablet by mouth daily.     naloxone (NARCAN) nasal spray 4 mg/0.1 mL Place into the nose.     Omega-3 Fatty Acids (FISH-EPA PO) Take by mouth.     pravastatin (PRAVACHOL) 40 MG tablet TAKE ONE TABLET BY MOUTH AT BEDTIME FOR CHOLESTEROL     senna-docusate (SENOKOT-S) 8.6-50 MG tablet Take by mouth.     Tiotropium Bromide-Olodaterol 2.5-2.5 MCG/ACT AERS Inhale 2 puffs into the lungs daily.     traZODone (DESYREL) 50 MG tablet Take 50-100 mg by mouth at bedtime.     VITAMIN D, CHOLECALCIFEROL, PO Take 2,000 Units by mouth daily.     No  current facility-administered medications for this visit.    Physical Exam:     BP 128/70   Pulse 70   Ht 5\' 5"  (1.651 m)   Wt 224 lb (101.6 kg)   BMI 37.28 kg/m   GENERAL:  Pleasant female in NAD PSYCH: : Cooperative, normal affect NEURO: Alert and oriented x 3, no focal neurologic deficits Rectal exam: Sensation intact and preserved anal wink.  Grade 2 Walker noted in RP and RA position on exam.  No external anal fissures noted. Normal sphincter tone. No palpable mass. No blood on the exam glove. (Chaperone: Lucius Conn, CMA).   IMPRESSION and PLAN:    #1.  Symptomatic internal Walker: PROCEDURE NOTE: The patient presents with symptomatic grade 2 Walker, unresponsive to maximal medical therapy, requesting rubber band ligation of 7 emetic hemorrhoidal disease.  All risks, benefits and alternative forms of therapy were described and informed consent was obtained.  In the Left Lateral Decubitus position, anoscopic examination revealed grade 2 Walker in the RA and RP position(s).  The anorectum was pre-medicated with [facility]. The decision was made to band the RP internal hemorrhoid, and the [facility] System was used to perform band ligation without complication.  Digital anorectal examination was then performed to assure proper positioning of the band, and to adjust the banded tissue as required.  The patient was discharged home without pain or other issues.  Dietary and behavioral recommendations were given and along with follow-up instructions.     The following adjunctive treatments were recommended:  -Resume high-fiber diet with fiber supplement (i.e. Citrucel or Benefiber) with goal for soft stools without straining to have a BM. -Resume adequate fluid intake.  The patient will return in 4+ weeks for follow-up and possible additional banding as required. No complications were encountered and the patient tolerated the procedure well.     #2.  IBS - Continue current medication regimen - Will follow-up with Dr. Chales Abrahams for ongoing care      Shellia Cleverly ,DO, [facility] 02/09/2024, 11:43 AM

## 2024-02-09 NOTE — Patient Instructions (Signed)
 You have been scheduled for a hemorrhoid banding #3 appointment on 04/24/24 at 11:20 AM. Please arrive 10 minutes early for registration. If you need to reschedule or cancel this appointment please call [phone] as soon as possible. Thank you.  HEMORRHOID BANDING PROCEDURE    FOLLOW-UP CARE   The procedure you have had should have been relatively painless since the banding of the area involved does not have nerve endings and there is no pain sensation.  The rubber band cuts off the blood supply to the hemorrhoid and the band may fall off as soon as 48 hours after the banding (the band may occasionally be seen in the toilet bowl following a bowel movement). You may notice a temporary feeling of fullness in the rectum which should respond adequately to plain Tylenol or Motrin.  Following the banding, avoid strenuous exercise that evening and resume full activity the next day.  A sitz bath (soaking in a warm tub) or bidet is soothing, and can be useful for cleansing the area after bowel movements.     To avoid constipation, take two tablespoons of natural wheat bran, natural oat bran, flax, Benefiber or any over the counter fiber supplement and increase your water intake to 7-8 glasses daily.    Unless you have been prescribed anorectal medication, do not put anything inside your rectum for two weeks: No suppositories, enemas, fingers, etc.  Occasionally, you may have more bleeding than usual after the banding procedure.  This is often from the untreated hemorrhoids rather than the treated one.  Don't be concerned if there is a tablespoon or so of blood.  If there is more blood than this, lie flat with your bottom higher than your head and apply an ice pack to the area. If the bleeding does not stop within a half an hour or if you feel faint, call our office at [phone] or go to the emergency room.  Problems are not common; however, if there is a substantial amount of bleeding, severe  pain, chills, fever or difficulty passing urine (very rare) or other problems, you should call us at [phone] or report to the nearest emergency room.  Do not stay seated continuously for more than 2-3 hours for a day or two after the procedure.  Tighten your buttock muscles 10-15 times every two hours and take 10-15 deep breaths every 1-2 hours.  Do not spend more than a few minutes on the toilet if you cannot empty your bowel; instead re-visit the toilet at a later time.   _______________________________________________________  If your blood pressure at your visit was 140/90 or greater, please contact your primary care physician to follow up on this.  _______________________________________________________  If you are age 51 or older, your body mass index should be between 23-30. Your Body mass index is 37.28 kg/m. If this is out of the aforementioned range listed, please consider follow up with your Primary Care Provider.  If you are age 79 or younger, your body mass index should be between 19-25. Your Body mass index is 37.28 kg/m. If this is out of the aformentioned range listed, please consider follow up with your Primary Care Provider.   ________________________________________________________  The [facility] providers would like to encourage you to use [facility] to communicate with providers for non-urgent requests or questions.  Due to long hold times on the telephone, sending your provider a message by [facility] may be a faster and more efficient way to get a response.  Please allow 48 business hours for a response.  Please remember that this is for non-urgent requests.  _______________________________________________________

## 2024-04-24 ENCOUNTER — Encounter: Admitting: Gastroenterology

## 2024-06-13 ENCOUNTER — Encounter: Admitting: Gastroenterology
